# Patient Record
Sex: Female | Born: 1975 | Race: Black or African American | Hispanic: No | Marital: Married | Smoking: Current every day smoker
Health system: Southern US, Community
[De-identification: ages and names within clinical notes are randomized; demographics above are authoritative.]

## PROBLEM LIST (undated history)

## (undated) ENCOUNTER — Inpatient Hospital Stay (HOSPITAL_COMMUNITY): Payer: Self-pay

## (undated) DIAGNOSIS — I10 Essential (primary) hypertension: Secondary | ICD-10-CM

## (undated) DIAGNOSIS — R51 Headache: Secondary | ICD-10-CM

## (undated) DIAGNOSIS — D649 Anemia, unspecified: Secondary | ICD-10-CM

## (undated) DIAGNOSIS — E119 Type 2 diabetes mellitus without complications: Secondary | ICD-10-CM

## (undated) DIAGNOSIS — B999 Unspecified infectious disease: Secondary | ICD-10-CM

## (undated) DIAGNOSIS — S42309A Unspecified fracture of shaft of humerus, unspecified arm, initial encounter for closed fracture: Secondary | ICD-10-CM

## (undated) DIAGNOSIS — B379 Candidiasis, unspecified: Secondary | ICD-10-CM

## (undated) DIAGNOSIS — O139 Gestational [pregnancy-induced] hypertension without significant proteinuria, unspecified trimester: Secondary | ICD-10-CM

## (undated) DIAGNOSIS — A599 Trichomoniasis, unspecified: Secondary | ICD-10-CM

## (undated) HISTORY — DX: Anemia, unspecified: D64.9

## (undated) HISTORY — PX: WISDOM TOOTH EXTRACTION: SHX21

## (undated) HISTORY — DX: Headache: R51

## (undated) HISTORY — DX: Unspecified fracture of shaft of humerus, unspecified arm, initial encounter for closed fracture: S42.309A

## (undated) HISTORY — DX: Candidiasis, unspecified: B37.9

## (undated) HISTORY — DX: Unspecified infectious disease: B99.9

---

## 1998-11-13 ENCOUNTER — Other Ambulatory Visit: Admission: RE | Admit: 1998-11-13 | Discharge: 1998-11-13 | Payer: Self-pay | Admitting: *Deleted

## 1998-12-27 ENCOUNTER — Emergency Department (HOSPITAL_COMMUNITY): Admission: EM | Admit: 1998-12-27 | Discharge: 1998-12-27 | Payer: Self-pay | Admitting: *Deleted

## 1999-03-21 ENCOUNTER — Inpatient Hospital Stay (HOSPITAL_COMMUNITY): Admission: AD | Admit: 1999-03-21 | Discharge: 1999-03-21 | Payer: Self-pay | Admitting: Obstetrics and Gynecology

## 1999-04-30 ENCOUNTER — Inpatient Hospital Stay (HOSPITAL_COMMUNITY): Admission: AD | Admit: 1999-04-30 | Discharge: 1999-04-30 | Payer: Self-pay | Admitting: Obstetrics and Gynecology

## 1999-05-07 DIAGNOSIS — I10 Essential (primary) hypertension: Secondary | ICD-10-CM

## 1999-05-07 HISTORY — DX: Essential (primary) hypertension: I10

## 1999-05-24 ENCOUNTER — Inpatient Hospital Stay (HOSPITAL_COMMUNITY): Admission: AD | Admit: 1999-05-24 | Discharge: 1999-05-26 | Payer: Self-pay | Admitting: Obstetrics and Gynecology

## 2000-07-21 ENCOUNTER — Other Ambulatory Visit: Admission: RE | Admit: 2000-07-21 | Discharge: 2000-07-21 | Payer: Self-pay | Admitting: Gynecology

## 2001-06-29 ENCOUNTER — Emergency Department (HOSPITAL_COMMUNITY): Admission: EM | Admit: 2001-06-29 | Discharge: 2001-06-29 | Payer: Self-pay

## 2001-09-04 ENCOUNTER — Encounter: Admission: RE | Admit: 2001-09-04 | Discharge: 2001-09-04 | Payer: Self-pay | Admitting: Internal Medicine

## 2001-09-04 ENCOUNTER — Encounter: Payer: Self-pay | Admitting: Internal Medicine

## 2001-10-28 ENCOUNTER — Encounter: Payer: Self-pay | Admitting: Internal Medicine

## 2001-10-28 ENCOUNTER — Ambulatory Visit (HOSPITAL_COMMUNITY): Admission: RE | Admit: 2001-10-28 | Discharge: 2001-10-28 | Payer: Self-pay | Admitting: Internal Medicine

## 2002-07-05 ENCOUNTER — Other Ambulatory Visit: Admission: RE | Admit: 2002-07-05 | Discharge: 2002-07-05 | Payer: Self-pay | Admitting: *Deleted

## 2002-08-31 ENCOUNTER — Encounter: Payer: Self-pay | Admitting: *Deleted

## 2002-08-31 ENCOUNTER — Encounter: Admission: RE | Admit: 2002-08-31 | Discharge: 2002-08-31 | Payer: Self-pay | Admitting: *Deleted

## 2002-12-01 ENCOUNTER — Inpatient Hospital Stay (HOSPITAL_COMMUNITY): Admission: AD | Admit: 2002-12-01 | Discharge: 2002-12-01 | Payer: Self-pay | Admitting: *Deleted

## 2002-12-02 ENCOUNTER — Inpatient Hospital Stay (HOSPITAL_COMMUNITY): Admission: AD | Admit: 2002-12-02 | Discharge: 2002-12-02 | Payer: Self-pay | Admitting: Obstetrics and Gynecology

## 2002-12-17 ENCOUNTER — Inpatient Hospital Stay (HOSPITAL_COMMUNITY): Admission: AD | Admit: 2002-12-17 | Discharge: 2002-12-20 | Payer: Self-pay | Admitting: Obstetrics and Gynecology

## 2002-12-17 ENCOUNTER — Encounter: Payer: Self-pay | Admitting: Obstetrics and Gynecology

## 2002-12-18 ENCOUNTER — Encounter: Payer: Self-pay | Admitting: Obstetrics and Gynecology

## 2003-01-05 ENCOUNTER — Inpatient Hospital Stay (HOSPITAL_COMMUNITY): Admission: AD | Admit: 2003-01-05 | Discharge: 2003-01-05 | Payer: Self-pay | Admitting: Obstetrics & Gynecology

## 2003-01-09 ENCOUNTER — Inpatient Hospital Stay (HOSPITAL_COMMUNITY): Admission: AD | Admit: 2003-01-09 | Discharge: 2003-01-09 | Payer: Self-pay | Admitting: *Deleted

## 2003-01-18 ENCOUNTER — Encounter (INDEPENDENT_AMBULATORY_CARE_PROVIDER_SITE_OTHER): Payer: Self-pay

## 2003-01-18 ENCOUNTER — Inpatient Hospital Stay (HOSPITAL_COMMUNITY): Admission: AD | Admit: 2003-01-18 | Discharge: 2003-01-20 | Payer: Self-pay | Admitting: *Deleted

## 2003-01-20 ENCOUNTER — Encounter: Payer: Self-pay | Admitting: *Deleted

## 2003-01-20 ENCOUNTER — Encounter: Payer: Self-pay | Admitting: Obstetrics and Gynecology

## 2003-02-18 ENCOUNTER — Other Ambulatory Visit: Admission: RE | Admit: 2003-02-18 | Discharge: 2003-02-18 | Payer: Self-pay | Admitting: *Deleted

## 2003-12-03 ENCOUNTER — Emergency Department (HOSPITAL_COMMUNITY): Admission: EM | Admit: 2003-12-03 | Discharge: 2003-12-03 | Payer: Self-pay | Admitting: Family Medicine

## 2004-09-12 ENCOUNTER — Emergency Department (HOSPITAL_COMMUNITY): Admission: EM | Admit: 2004-09-12 | Discharge: 2004-09-12 | Payer: Self-pay | Admitting: Emergency Medicine

## 2005-06-19 ENCOUNTER — Emergency Department (HOSPITAL_COMMUNITY): Admission: EM | Admit: 2005-06-19 | Discharge: 2005-06-19 | Payer: Self-pay | Admitting: Family Medicine

## 2005-06-28 ENCOUNTER — Emergency Department (HOSPITAL_COMMUNITY): Admission: EM | Admit: 2005-06-28 | Discharge: 2005-06-28 | Payer: Self-pay | Admitting: Emergency Medicine

## 2005-11-27 ENCOUNTER — Emergency Department (HOSPITAL_COMMUNITY): Admission: EM | Admit: 2005-11-27 | Discharge: 2005-11-27 | Payer: Self-pay | Admitting: Family Medicine

## 2007-06-01 ENCOUNTER — Emergency Department (HOSPITAL_COMMUNITY): Admission: EM | Admit: 2007-06-01 | Discharge: 2007-06-01 | Payer: Self-pay | Admitting: Emergency Medicine

## 2007-07-06 ENCOUNTER — Emergency Department (HOSPITAL_COMMUNITY): Admission: EM | Admit: 2007-07-06 | Discharge: 2007-07-06 | Payer: Self-pay | Admitting: Emergency Medicine

## 2007-08-18 ENCOUNTER — Inpatient Hospital Stay (HOSPITAL_COMMUNITY): Admission: AD | Admit: 2007-08-18 | Discharge: 2007-08-18 | Payer: Self-pay | Admitting: Obstetrics and Gynecology

## 2007-09-17 ENCOUNTER — Ambulatory Visit: Payer: Self-pay | Admitting: Gynecology

## 2008-04-06 ENCOUNTER — Emergency Department (HOSPITAL_COMMUNITY): Admission: EM | Admit: 2008-04-06 | Discharge: 2008-04-06 | Payer: Self-pay | Admitting: Emergency Medicine

## 2009-03-10 ENCOUNTER — Emergency Department (HOSPITAL_COMMUNITY): Admission: EM | Admit: 2009-03-10 | Discharge: 2009-03-10 | Payer: Self-pay | Admitting: Emergency Medicine

## 2009-03-11 ENCOUNTER — Emergency Department (HOSPITAL_COMMUNITY): Admission: EM | Admit: 2009-03-11 | Discharge: 2009-03-11 | Payer: Self-pay | Admitting: Emergency Medicine

## 2009-05-02 ENCOUNTER — Emergency Department (HOSPITAL_COMMUNITY): Admission: EM | Admit: 2009-05-02 | Discharge: 2009-05-02 | Payer: Self-pay | Admitting: Emergency Medicine

## 2009-08-21 ENCOUNTER — Emergency Department (HOSPITAL_COMMUNITY): Admission: EM | Admit: 2009-08-21 | Discharge: 2009-08-21 | Payer: Self-pay | Admitting: Family Medicine

## 2009-12-31 ENCOUNTER — Emergency Department (HOSPITAL_COMMUNITY): Admission: EM | Admit: 2009-12-31 | Discharge: 2009-12-31 | Payer: Self-pay | Admitting: Family Medicine

## 2010-01-02 ENCOUNTER — Emergency Department (HOSPITAL_COMMUNITY): Admission: EM | Admit: 2010-01-02 | Discharge: 2010-01-02 | Payer: Self-pay | Admitting: Family Medicine

## 2010-07-20 LAB — CULTURE, ROUTINE-ABSCESS

## 2010-08-19 ENCOUNTER — Inpatient Hospital Stay (INDEPENDENT_AMBULATORY_CARE_PROVIDER_SITE_OTHER)
Admission: RE | Admit: 2010-08-19 | Discharge: 2010-08-19 | Disposition: A | Discharge status: Home / Self Care | Payer: Self-pay | Source: Ambulatory Visit | Attending: Family Medicine | Admitting: Family Medicine

## 2010-08-19 ENCOUNTER — Ambulatory Visit (INDEPENDENT_AMBULATORY_CARE_PROVIDER_SITE_OTHER): Payer: Self-pay

## 2010-08-19 DIAGNOSIS — S93609A Unspecified sprain of unspecified foot, initial encounter: Secondary | ICD-10-CM

## 2010-09-21 NOTE — H&P (Signed)
   NAME:  Najera, Korena A                       ACCOUNT NO.:  [id number]   MEDICAL RECORD NO.:  [id number]                   PATIENT TYPE:   LOCATION:                                       FACILITY:   PHYSICIAN:  [place] B. Earlene Plater, M.D.               DATE OF BIRTH:  November 11, 1975   DATE OF ADMISSION:  01/18/2003  DATE OF DISCHARGE:                                HISTORY & PHYSICAL   ADMISSION DIAGNOSES:  1. A 37 week intrauterine pregnancy.  2. Mild pre-eclampsia.   HISTORY OF PRESENT ILLNESS:  A 35 year old African American female gravida  4, para 3, at 37+ weeks with mild pre-eclampsia.  Patient has been followed  as an outpatient from home with bed rest and weekly biophysical profiles and  intermittent growth surveillance.  All findings have been reassuring.  Last  estimated fetal weight 6 pounds 4 ounces, on January 14, 2003, with normal  amniotic fluid index.  The patient has occasional mild headache and  occasional intermittent scintillations but no severe headache, and no  permanent visual disturbances.  She has been otherwise stable with blood  pressures in the 120s-130s/80s-90s range.  As she is now 37 weeks and has a  favorable cervix, presents for admission for induction of labor.   OBJECTIVE:  GENERAL:  Same.  ABDOMEN:  Fundal height appropriate.  Fetal heart tones are heard.  VITAL SIGNS:  Weight is 203, blood pressure 120/86.  GENERAL:  Alert and oriented, no acute distress.  SKIN:  Warm and dry.  No lesions.  HEART:  Regular, rate and rhythm.  LUNGS:  Clear to auscultation.  ABDOMEN:  Liver and spleen normal.  No hernias.  No tenderness in the upper  quadrants.  PELVIC:  Cervix is 4-cm dilated, 2-cm long, -2 station and vertex.  No  hyperflexia or clonus in the lower extremities.   ASSESSMENT:  1. A 37+ week intrauterine pregnancy.  2. Mild pre-eclampsia  3. Favorable cervix.   PLAN:  Admission for induction of labor.            Gerri Spore B. Earlene Plater, M.D.    WBD/MEDQ  D:  01/14/2003  T:  01/14/2003  Job:  [id number]

## 2010-09-21 NOTE — H&P (Signed)
NAME:  Danielle [name], Danielle [name]                       ACCOUNT NO.:  [id number]   MEDICAL RECORD NO.:  [id number]                   PATIENT TYPE:  INP   LOCATION:  9151                                 FACILITY:  [facility]   PHYSICIAN:  [place] B. Earlene Plater, M.D.               DATE OF BIRTH:  March 21, 1976   DATE OF ADMISSION:  12/17/2002  DATE OF DISCHARGE:                                HISTORY & PHYSICAL   ADMISSION DIAGNOSES:  1. [name] 32-week intrauterine pregnancy.  2. Mild preeclampsia.  3. Nonreassuring NST in the office x2 today.  4. Biophysical profile 2/10 in the office today.   HISTORY OF PRESENT ILLNESS:  The patient is [name] 35 year old African American  female, gravida 4, para 3 at 70 plus weeks, [facility] February 08, 2003, who  presents for admission with mild preeclampsia and the above issues. She has  been monitored as an outpatient with blood pressures typically in the  130s/80s range on bedrest. She has had reassuring fetal surveillance twice  weekly in the office. Most recently today, she had [name] nonreactive NST,  although she was found to have [name] single 15 x 15 acceleration with good  variability, no decelerations. This was repeated with [name] similar result.  Subsequent biophysical profile today was 2/10, 2 points for fluid volume.  Also an incidental note was made of [name] dilated fetal bladder and apparent  distal  ureteral dilatation without renal hydronephrosis.   Given the above findings, the patient is admitted for continuous electronic  fetal monitoring and repeat  ultrasound testing. She has had steroids 2  weeks ago due to [name] history of preterm cervical change. The patient denies  headaches, visual disturbances, upper  abdominal pain and reports good fetal  activity.   PAST MEDICAL HISTORY:  Chronic  hypertension. She has been off medications  throughout pregnancy with blood pressures outlined above.   PAST SURGICAL HISTORY:  None.   FAMILY HISTORY:  Noncontributory.   MEDICATIONS:   Prenatal vitamins.   ALLERGIES:  Penicillin.   LABORATORY DATA:  Prenatal labs:  [name] positive. Rubella immune. Hepatitis B,  HIV, RPR, GC and Chlamydia all negative. Glucola normal. Most recent  24  hour urine at the end of July 338 mg in 24 hours.   PHYSICAL EXAMINATION:  VITAL SIGNS:  Blood pressure 132/88, weight 193,  fetal heart tones in the 140s.  GENERAL:  Alert and oriented, in no acute distress.  SKIN:  Warm and dry, no lesions.  HEART:  Regular rate and rhythm.  LUNGS:  Clear to auscultation.  ABDOMEN:  Gravid, fundal height appropriate. No upper  abdominal  tenderness.  PELVIC:  Cervix is 2 cm dilated, 3 cm long, ___________ and vertex.   LABORATORY FINDINGS:  An ultrasound in the office today  shows amniotic  fluid index is within normal limits. Biophysical profile is 2/10 as outlined  above. Cystic enlargement of the bladder with possible  dilated distal  ureters incidentally noted. Again no renal hydronephrosis is noted.   ASSESSMENT:  [name] 32 week intrauterine pregnancy with preeclampsia with  nonreassuring tracing in the office, although the overall impression on non  stress testing in the office was reassuring, it was not technically  reactive. Therefore the patient is admitted for continuous monitoring,  repeat ultrasound testing and will repeat [name] 24 hour urine. The patient is  aware that if long-term was to deteriorate delivery would be indicated.                                               Gerri Spore B. Earlene Plater, M.D.    WBD/MEDQ  D:  12/17/2002  T:  12/17/2002  Job:  [id number]

## 2010-09-21 NOTE — Consult Note (Signed)
   NAME:  Danielle Danielle [name], Danielle Danielle [name]                       ACCOUNT NO.:  [id number]   MEDICAL RECORD NO.:  [id number]                   PATIENT TYPE:  MAT   LOCATION:  [facility]C                                 FACILITY:  [facility]   PHYSICIAN:  Lenoard Aden, M.D.             DATE OF BIRTH:  1975-09-03   DATE OF CONSULTATION:  12/01/2002  DATE OF DISCHARGE:                                   CONSULTATION   CHIEF COMPLAINT:  Rule out preeclampsia.   HISTORY OF PRESENT ILLNESS:  The patient is Danielle [name] 35 year old African American  female, G4, P3, at 30-6/[redacted] weeks gestation.  She presents with elevated blood  pressure and preterm cervical change for evaluation.   ALLERGIES:  Remarkable for allergies to PENICILLIN.   MEDICATIONS:  Prenatal vitamins.   PAST MEDICAL HISTORY:  History of SVT x 3.  No other medical or surgical  hospitalization.  History of smoking abuse.  History of hypertension  previously on medications prior to pregnancy.   FAMILY HISTORY:  Family history of hypertension, diabetes, and stroke.   PRENATAL LABORATORY DATA:  Blood type Danielle [name]+.  Rubella immune.  Hepatitis and  HIV negative.   PHYSICAL EXAMINATION:  GENERAL APPEARANCE:  She is Danielle [name] well-developed, well-  nourished, African American female in no acute distress.  HEENT:  Normal.  LUNGS:  Clear.  HEART:  Regular rate and rhythm.  ABDOMEN:  Soft, gravid, and nontender.  No right upper quadrant tenderness.  EXTREMITIES:  DTRs 1+.  No clonus.  2+ pretibial edema noted.  PELVIC:  The cervix per Dr. Earlene Plater is 2 cm dilated in the office.  She has  received betamethasone 12.5 mg IM today.  NEUROLOGIC:  Nonfocal.  NST is reassuring, but no reactive.  No evidence of  decelerations.  No evidence of contractions.   IMPRESSION:  1. Preterm cervical change.  2. 30-week intrauterine pregnancy.  3. History of chronic hypertension with stable blood pressure on serial     management today.  Normal pregnancy-induced hypertension labs noted.    PLAN:  1. Check 24-hour urine.  2.     Follow up in the office within one week.  3. Preeclamptic warnings given.  4. Hypertensive precautions given.                                               Lenoard Aden, M.D.    RJT/MEDQ  D:  12/01/2002  T:  12/01/2002  Job:  [id number]   cc:   [facility]

## 2010-10-31 ENCOUNTER — Inpatient Hospital Stay (INDEPENDENT_AMBULATORY_CARE_PROVIDER_SITE_OTHER)
Admission: RE | Admit: 2010-10-31 | Discharge: 2010-10-31 | Disposition: A | Discharge status: Home / Self Care | Payer: Self-pay | Source: Ambulatory Visit | Attending: Emergency Medicine | Admitting: Emergency Medicine

## 2010-10-31 DIAGNOSIS — I10 Essential (primary) hypertension: Secondary | ICD-10-CM

## 2010-11-11 ENCOUNTER — Emergency Department (HOSPITAL_COMMUNITY)
Admission: EM | Admit: 2010-11-11 | Discharge: 2010-11-11 | Disposition: A | Discharge status: Home / Self Care | Payer: Self-pay | Attending: Emergency Medicine | Admitting: Emergency Medicine

## 2010-11-11 DIAGNOSIS — IMO0002 Reserved for concepts with insufficient information to code with codable children: Secondary | ICD-10-CM | POA: Insufficient documentation

## 2010-11-11 DIAGNOSIS — I10 Essential (primary) hypertension: Secondary | ICD-10-CM | POA: Insufficient documentation

## 2010-11-11 DIAGNOSIS — I499 Cardiac arrhythmia, unspecified: Secondary | ICD-10-CM | POA: Insufficient documentation

## 2010-11-14 LAB — CULTURE, ROUTINE-ABSCESS

## 2011-01-29 LAB — CBC
HCT: 37
Hemoglobin: 12.8
MCHC: 34.5
MCV: 87.1
Platelets: 354
RBC: 4.25
RDW: 14.6
WBC: 11.6 — ABNORMAL HIGH

## 2011-01-29 LAB — GC/CHLAMYDIA PROBE AMP, GENITAL
Chlamydia, DNA Probe: NEGATIVE
GC Probe Amp, Genital: NEGATIVE

## 2011-01-29 LAB — URINALYSIS, ROUTINE W REFLEX MICROSCOPIC
Bilirubin Urine: NEGATIVE
Ketones, ur: NEGATIVE
Nitrite: NEGATIVE
Urobilinogen, UA: 0.2

## 2011-01-29 LAB — WET PREP, GENITAL: Yeast Wet Prep HPF POC: NONE SEEN

## 2011-01-29 LAB — POCT PREGNANCY, URINE
Operator id: 25114
Preg Test, Ur: NEGATIVE

## 2011-05-07 NOTE — L&D Delivery Note (Signed)
Delivery Note Pt's labor continued spontaneously.  At 6:02 AM a viable female "Danielle [name]," was delivered via  (Presentation: ; ROA ).  APGAR: 8, 9; weight 5 lb 1 oz (2295 g).  NICU present at delivery. Placenta status: Intact, Spontaneous, schutz--sent to Pathology secondary to preterm delivery.  Heavy vaginal bleeding accompanied placenta, and 2-3 gushes following, and pt received of cytotec pr.   Cord: 3 vessels with the following complications: Short.  Cord pH: not collected.  Anesthesia:  epidural Episiotomy: none Lacerations: none Suture Repair: n/a Est. Blood Loss (mL): 400  Mom to postpartum.  Baby to nursery-stable. Planning outpatient circumcision.  Wants to Breastfeed. Sent cath u/a.  Per c/w Dr. Pennie Rushing, will start pt on Procardia XL 30mg  po qd.  Luke Falero H 04/10/2012, 6:38 AM

## 2011-08-21 ENCOUNTER — Other Ambulatory Visit: Payer: Self-pay

## 2011-08-21 ENCOUNTER — Encounter (HOSPITAL_COMMUNITY): Payer: Self-pay | Admitting: *Deleted

## 2011-08-21 ENCOUNTER — Emergency Department (HOSPITAL_COMMUNITY)
Admission: EM | Admit: 2011-08-21 | Discharge: 2011-08-21 | Disposition: A | Discharge status: Home / Self Care | Payer: Medicaid Other | Attending: Emergency Medicine | Admitting: Emergency Medicine

## 2011-08-21 ENCOUNTER — Emergency Department (HOSPITAL_COMMUNITY): Payer: Medicaid Other

## 2011-08-21 DIAGNOSIS — R5381 Other malaise: Secondary | ICD-10-CM | POA: Insufficient documentation

## 2011-08-21 DIAGNOSIS — F17200 Nicotine dependence, unspecified, uncomplicated: Secondary | ICD-10-CM | POA: Insufficient documentation

## 2011-08-21 DIAGNOSIS — R5383 Other fatigue: Secondary | ICD-10-CM | POA: Insufficient documentation

## 2011-08-21 DIAGNOSIS — R42 Dizziness and giddiness: Secondary | ICD-10-CM | POA: Insufficient documentation

## 2011-08-21 DIAGNOSIS — R51 Headache: Secondary | ICD-10-CM | POA: Insufficient documentation

## 2011-08-21 DIAGNOSIS — R0602 Shortness of breath: Secondary | ICD-10-CM | POA: Insufficient documentation

## 2011-08-21 DIAGNOSIS — I10 Essential (primary) hypertension: Secondary | ICD-10-CM | POA: Insufficient documentation

## 2011-08-21 DIAGNOSIS — J40 Bronchitis, not specified as acute or chronic: Secondary | ICD-10-CM | POA: Insufficient documentation

## 2011-08-21 HISTORY — DX: Essential (primary) hypertension: I10

## 2011-08-21 LAB — DIFFERENTIAL
Basophils Absolute: 0.1 10*3/uL (ref 0.0–0.1)
Basophils Relative: 1 % (ref 0–1)
Eosinophils Absolute: 0.4 10*3/uL (ref 0.0–0.7)
Eosinophils Relative: 3 % (ref 0–5)
Lymphocytes Relative: 27 % (ref 12–46)
Monocytes Absolute: 1.3 10*3/uL — ABNORMAL HIGH (ref 0.1–1.0)

## 2011-08-21 LAB — COMPREHENSIVE METABOLIC PANEL
ALT: 11 U/L (ref 0–35)
AST: 21 U/L (ref 0–37)
Albumin: 3.9 g/dL (ref 3.5–5.2)
CO2: 21 mEq/L (ref 19–32)
Calcium: 10.3 mg/dL (ref 8.4–10.5)
Creatinine, Ser: 0.51 mg/dL (ref 0.50–1.10)
GFR calc non Af Amer: >90 mL/min (ref >90)
Sodium: 138 mEq/L (ref 135–145)
Total Protein: 7.9 g/dL (ref 6.0–8.3)

## 2011-08-21 LAB — URINALYSIS, ROUTINE W REFLEX MICROSCOPIC
Bilirubin Urine: NEGATIVE
Glucose, UA: NEGATIVE mg/dL
Specific Gravity, Urine: 1.013 (ref 1.005–1.030)
Urobilinogen, UA: 1 mg/dL (ref 0.0–1.0)
pH: 6.5 (ref 5.0–8.0)

## 2011-08-21 LAB — URINE MICROSCOPIC-ADD ON

## 2011-08-21 LAB — CBC
HCT: 41 % (ref 36.0–46.0)
MCHC: 33.2 g/dL (ref 30.0–36.0)
MCV: 88.6 fL (ref 78.0–100.0)
Platelets: 400 10*3/uL (ref 150–400)
RDW: 14 % (ref 11.5–15.5)
WBC: 14.2 10*3/uL — ABNORMAL HIGH (ref 4.0–10.5)

## 2011-08-21 LAB — POCT I-STAT TROPONIN I: Troponin i, poc: 0 ng/mL (ref 0.00–0.08)

## 2011-08-21 MED ORDER — ALBUTEROL SULFATE (5 MG/ML) 0.5% IN NEBU
5.0000 mg | INHALATION_SOLUTION | Freq: Once | RESPIRATORY_TRACT | Status: AC
  Administered 2011-08-21: 5 mg via RESPIRATORY_TRACT
  Filled 2011-08-21: qty 1

## 2011-08-21 MED ORDER — ALBUTEROL SULFATE HFA 108 (90 BASE) MCG/ACT IN AERS
2.0000 | INHALATION_SPRAY | Freq: Four times a day (QID) | RESPIRATORY_TRACT | Status: DC
  Administered 2011-08-21: 2 via RESPIRATORY_TRACT
  Filled 2011-08-21: qty 6.7

## 2011-08-21 MED ORDER — SODIUM CHLORIDE 0.9 % IV BOLUS (SEPSIS)
1000.0000 mL | Freq: Once | INTRAVENOUS | Status: AC
  Administered 2011-08-21: 1000 mL via INTRAVENOUS

## 2011-08-21 NOTE — Discharge Instructions (Signed)
All labs and imaging have been normal today. However it is very important to followup with the cardiologist for close evaluation especially if symptoms worsen. Please return to the emergency department if they do. The x-ray indicated that you have bronchitis. I have provided you with an inhaler here. please use this one to 2 puffs every 4-6 hours  Dizziness Dizziness is a common problem. It is a feeling of unsteadiness or lightheadedness. You may feel like you are about to faint. Dizziness can lead to injury if you stumble or fall. A person of any age group can suffer from dizziness, but dizziness is more common in older adults. CAUSES  Dizziness can be caused by many different things, including:  Middle ear problems.   Standing for too long.   Infections.   An allergic reaction.   Aging.   An emotional response to something, such as the sight of blood.   Side effects of medicines.   Fatigue.   Problems with circulation or blood pressure.   Excess use of alcohol, medicines, or illegal drug use.   Breathing too fast (hyperventilation).   An arrhythmia or problems with your heart rhythm.   Low red blood cell count (anemia).   Pregnancy.   Vomiting, diarrhea, fever, or other illnesses that cause dehydration.   Diseases or conditions such as Parkinson's disease, high blood pressure (hypertension), diabetes, and thyroid problems.   Exposure to extreme heat.  DIAGNOSIS  To find the cause of your dizziness, your caregiver may do a physical exam, lab tests, radiologic imaging scans, or an electrocardiography test (ECG).  TREATMENT  Treatment of dizziness depends on the cause of your symptoms and can vary greatly. HOME CARE INSTRUCTIONS   Drink enough fluids to keep your urine clear or pale yellow. This is especially important in very hot weather. In the elderly, it is also important in cold weather.   If your dizziness is caused by medicines, take them exactly as directed.  When taking blood pressure medicines, it is especially important to get up slowly.   Rise slowly from chairs and steady yourself until you feel okay.   In the morning, first sit up on the side of the bed. When this seems okay, stand slowly while holding onto something until you know your balance is fine.   If you need to stand in one place for a long time, be sure to move your legs often. Tighten and relax the muscles in your legs while standing.   If dizziness continues to be a problem, have someone stay with you for a day or two. Do this until you feel you are well enough to stay alone. Have the person call your caregiver if he or she notices changes in you that are concerning.   Do not drive or use heavy machinery if you feel dizzy.  SEEK IMMEDIATE MEDICAL CARE IF:   Your dizziness or lightheadedness gets worse.   You feel nauseous or vomit.   You develop problems with talking, walking, weakness, or using your arms, hands, or legs.   You are not thinking clearly or you have difficulty forming sentences. It may take a friend or family member to determine if your thinking is normal.   You develop chest pain, abdominal pain, shortness of breath, or sweating.   Your vision changes.   You notice any bleeding.   You have side effects from medicine that seems to be getting worse rather than better.  MAKE SURE YOU:   Understand  these instructions.   Will watch your condition.   Will get help right away if you are not doing well or get worse.  Document Released: 10/16/2000 Document Revised: 04/11/2011 Document Reviewed: 11/09/2010 [facility] Patient Information 2012 [place], [place].

## 2011-08-21 NOTE — ED Notes (Addendum)
Pt complains of lightheadedness which she states is from her HTN. She took her Verapamil this morning and she's on the last refill on it. She's been trying to get a refill on her medications, but she doesn't have insurance. She got her last prescriptions from Urgent Care. The lightheadedness started Sunday night and it's been coming and going. Occasional unifocal PVCs noted in the EKG.

## 2011-08-21 NOTE — ED Notes (Signed)
Pt undressed, in gown, on monitor, continuous pulse oximetry and blood pressure cuff 

## 2011-08-21 NOTE — ED Notes (Signed)
[id number] at triage

## 2011-08-21 NOTE — ED Provider Notes (Signed)
History     CSN: [id number]  Arrival date & time 08/21/11  0102   First MD Initiated Contact with Patient 08/21/11 1005     10:55 AM HPI MERIDETH BOSQUE is a 36 y.o. female complaining of feeling dizzy, lightheaded, and sluggish 4 days. States symptoms seem to worsen today while she was at work. States today she seemed to "black out" while she was sitting down at work. States that she did not fully syncopized. Reports associated with a frontal headache, mild shortness of breath. Denies chest pain, numbness, tingling, aphasia, ataxia. States she was "too weak to work". Reports her blood pressure medications have been less effective and she thinks the dizziness is due to hypertension. H/o early FHx, HTN, and smoking.   The history is provided by the patient.    Past Medical History  Diagnosis Date  . Hypertension     History reviewed. No pertinent past surgical history.  No family history on file.  History  Substance Use Topics  . Smoking status: Current Everyday Smoker  . Smokeless tobacco: Not on file  . Alcohol Use: Yes     occ    OB History    Grav Para Term Preterm Abortions TAB SAB Ect Mult Living                  Review of Systems  Constitutional: Positive for fatigue. Negative for fever and chills.  HENT: Negative for sore throat, neck pain and neck stiffness.   Respiratory: Negative for cough and shortness of breath.   Cardiovascular: Negative for chest pain.  Gastrointestinal: Negative for nausea, vomiting, abdominal pain, diarrhea and constipation.  Genitourinary: Negative for dysuria, urgency, frequency, flank pain, vaginal bleeding, vaginal discharge and pelvic pain.  Musculoskeletal: Negative for myalgias, back pain and gait problem.  Neurological: Positive for weakness. Negative for dizziness, numbness and headaches. Syncope: near syncope.  All other systems reviewed and are negative.    Allergies  Penicillins  Home Medications   Current  Outpatient Rx  Name Route Sig Dispense Refill  . IBUPROFEN 200 MG PO TABS Oral Take 200 mg by mouth every 6 (six) hours as needed. For pain    . VERAPAMIL HCL 80 MG PO TABS Oral Take 80 mg by mouth daily.      BP 143/93  Pulse 88  Temp(Src) 98.3 F (36.8 C) (Oral)  Resp 20  Ht 5\' 7"  (1.702 m)  Wt 165 lb (74.844 kg)  BMI 25.84 kg/m2  SpO2 99%  LMP 07/28/2011  Physical Exam  Vitals reviewed. Constitutional: She is oriented to person, place, and time. Vital signs are normal. She appears well-developed and well-nourished. No distress.  HENT:  Head: Normocephalic and atraumatic.  Right Ear: External ear normal.  Left Ear: External ear normal.  Nose: Nose normal.  Mouth/Throat: Oropharynx is clear and moist. No oropharyngeal exudate.  Eyes: Conjunctivae are normal. Pupils are equal, round, and reactive to light.  Neck: Normal range of motion. Neck supple.  Cardiovascular: Normal rate, regular rhythm and normal heart sounds.  Exam reveals no gallop and no friction rub.   No murmur heard. Pulmonary/Chest: Effort normal and breath sounds normal. She has no wheezes. She has no rhonchi. She has no rales. She exhibits no tenderness.  Abdominal: She exhibits no distension and no mass. There is no tenderness. There is no rebound and no guarding.  Musculoskeletal: Normal range of motion.  Lymphadenopathy:    She has no cervical adenopathy.  Neurological: She is  alert and oriented to person, place, and time. No cranial nerve deficit. She exhibits normal muscle tone. Coordination normal.  Skin: Skin is warm and dry. No rash noted. No erythema. No pallor.    ED Course  Procedures  Results for orders placed during the hospital encounter of 08/21/11  CBC      Component Value Range   WBC 14.2 (*) 4.0 - 10.5 (K/uL)   RBC 4.63  3.87 - 5.11 (MIL/uL)   Hemoglobin 13.6  12.0 - 15.0 (g/dL)   HCT 16.1  09.6 - 04.5 (%)   MCV 88.6  78.0 - 100.0 (fL)   MCH 29.4  26.0 - 34.0 (pg)   MCHC 33.2  30.0  - 36.0 (g/dL)   RDW 40.9  81.1 - 91.4 (%)   Platelets 400  150 - 400 (K/uL)  DIFFERENTIAL      Component Value Range   Neutrophils Relative 61  43 - 77 (%)   Neutro Abs 8.7 (*) 1.7 - 7.7 (K/uL)   Lymphocytes Relative 27  12 - 46 (%)   Lymphs Abs 3.8  0.7 - 4.0 (K/uL)   Monocytes Relative 9  3 - 12 (%)   Monocytes Absolute 1.3 (*) 0.1 - 1.0 (K/uL)   Eosinophils Relative 3  0 - 5 (%)   Eosinophils Absolute 0.4  0.0 - 0.7 (K/uL)   Basophils Relative 1  0 - 1 (%)   Basophils Absolute 0.1  0.0 - 0.1 (K/uL)  COMPREHENSIVE METABOLIC PANEL      Component Value Range   Sodium 138  135 - 145 (mEq/L)   Potassium 3.8  3.5 - 5.1 (mEq/L)   Chloride 103  96 - 112 (mEq/L)   CO2 21  19 - 32 (mEq/L)   Glucose, Bld 88  70 - 99 (mg/dL)   BUN 8  6 - 23 (mg/dL)   Creatinine, Ser 7.82  0.50 - 1.10 (mg/dL)   Calcium 95.6  8.4 - 10.5 (mg/dL)   Total Protein 7.9  6.0 - 8.3 (g/dL)   Albumin 3.9  3.5 - 5.2 (g/dL)   AST 21  0 - 37 (U/L)   ALT 11  0 - 35 (U/L)   Alkaline Phosphatase 108  39 - 117 (U/L)   Total Bilirubin 0.3  0.3 - 1.2 (mg/dL)   GFR calc non Af Amer >90  >90 (mL/min)   GFR calc Af Amer >90  >90 (mL/min)  URINALYSIS, ROUTINE W REFLEX MICROSCOPIC      Component Value Range   Color, Urine YELLOW  YELLOW    APPearance CLOUDY (*) CLEAR    Specific Gravity, Urine 1.013  1.005 - 1.030    pH 6.5  5.0 - 8.0    Glucose, UA NEGATIVE  NEGATIVE (mg/dL)   Hgb urine dipstick SMALL (*) NEGATIVE    Bilirubin Urine NEGATIVE  NEGATIVE    Ketones, ur NEGATIVE  NEGATIVE (mg/dL)   Protein, ur 30 (*) NEGATIVE (mg/dL)   Urobilinogen, UA 1.0  0.0 - 1.0 (mg/dL)   Nitrite NEGATIVE  NEGATIVE    Leukocytes, UA TRACE (*) NEGATIVE   URINE MICROSCOPIC-ADD ON      Component Value Range   Squamous Epithelial / LPF MANY (*) RARE    WBC, UA 3-6  <3 (WBC/hpf)   RBC / HPF 0-2  <3 (RBC/hpf)   Bacteria, UA FEW (*) RARE    Urine-Other MUCOUS PRESENT    POCT I-STAT TROPONIN I      Component Value Range  Troponin i,  poc 0.00  0.00 - 0.08 (ng/mL)   Comment 3           GLUCOSE, CAPILLARY      Component Value Range   Glucose-Capillary 111 (*) 70 - 99 (mg/dL)   Dg Chest 2 View  1/61/0960  *RADIOLOGY REPORT*  Clinical Data: Dizziness, hypertension, smoker.  CHEST - 2 VIEW  Comparison: None.  Findings: Heart is borderline in size.  Mild peribronchial thickening.  No confluent opacities or effusions.  No acute bony abnormality.  IMPRESSION: Bronchitic changes.  Original Report Authenticated By: Cyndie Chime, M.D.    ED ECG REPORT   Date: 08/21/2011  EKG Time: 4:14 PM  Rate: 78  Rhythm: sinus arrhythmia and premature ventricular contractions (PVC),    Axis: Left axis  Intervals:none  ST&T Change: nonspecific ST and T wave changes.   No significant changes since 07/06/2007     MDM  Suspect a viral infection however I did recommend patient followup with cardiology if symptoms persist. Patient slight elevation in white count and bronchitis on x-ray and no other findings. Advised patient to return to the emergency department for worsening symptoms. Patient agrees and is ready for discharge        Thomasene Lot, Cordelia Poche 08/21/11 4540

## 2011-08-21 NOTE — Progress Notes (Signed)
This CM has attempted to meet with patient on two different occasions this morning. However, patient has been occupied with physical exams and tests. Will attempt contact in the early afternoon.

## 2011-08-21 NOTE — ED Notes (Signed)
Pt was sitting at work and blanked out with her eyes open.  Pt sts she is feeling dizzy and doesnot feel like herself.  She has not felt like herself since she got up.  She feels sluggish and tired.    Pt woke up with frontal headache.  No other neuro deficits

## 2011-08-22 LAB — URINE CULTURE: Culture: NO GROWTH

## 2011-08-23 NOTE — ED Provider Notes (Signed)
Medical screening examination/treatment/procedure(s) were performed by non-physician practitioner and as supervising physician I was immediately available for consultation/collaboration.   Ka Flammer L Sade Hollon, MD 08/23/11 1642 

## 2011-09-01 ENCOUNTER — Encounter (HOSPITAL_COMMUNITY): Payer: Self-pay | Admitting: *Deleted

## 2011-09-01 ENCOUNTER — Inpatient Hospital Stay (HOSPITAL_COMMUNITY)
Admission: AD | Admit: 2011-09-01 | Discharge: 2011-09-01 | Disposition: A | Discharge status: Hospice | Payer: Medicaid Other | Source: Ambulatory Visit | Attending: Obstetrics & Gynecology | Admitting: Obstetrics & Gynecology

## 2011-09-01 DIAGNOSIS — O10019 Pre-existing essential hypertension complicating pregnancy, unspecified trimester: Secondary | ICD-10-CM

## 2011-09-01 DIAGNOSIS — I10 Essential (primary) hypertension: Secondary | ICD-10-CM | POA: Insufficient documentation

## 2011-09-01 DIAGNOSIS — O10919 Unspecified pre-existing hypertension complicating pregnancy, unspecified trimester: Secondary | ICD-10-CM

## 2011-09-01 DIAGNOSIS — Z3201 Encounter for pregnancy test, result positive: Secondary | ICD-10-CM | POA: Insufficient documentation

## 2011-09-01 HISTORY — DX: Trichomoniasis, unspecified: A59.9

## 2011-09-01 LAB — URINALYSIS, ROUTINE W REFLEX MICROSCOPIC
Glucose, UA: NEGATIVE mg/dL
Leukocytes, UA: NEGATIVE
Protein, ur: NEGATIVE mg/dL
Specific Gravity, Urine: 1.025 (ref 1.005–1.030)
pH: 6 (ref 5.0–8.0)

## 2011-09-01 LAB — URINE MICROSCOPIC-ADD ON

## 2011-09-01 LAB — POCT PREGNANCY, URINE: Preg Test, Ur: POSITIVE — AB

## 2011-09-01 MED ORDER — LABETALOL HCL 200 MG PO TABS: 200.0000 mg | ORAL_TABLET | Freq: Two times a day (BID) | ORAL | Status: DC

## 2011-09-01 NOTE — MAU Provider Note (Signed)
History     CSN: [id number]  Arrival date and time: 09/01/11 1457   None     No chief complaint on file.  HPI Danielle [name] 36 y.o. LMP 07-28-11.  Client is thinking she is pregnant due to late menses.  Stopped taking BP meds as she was not sure they were safe in pregnancy.  Has no pain or vaginal bleeding today.  Simply wanted to know if she was pregnant and to get the correct BP meds for pregnancy.  Plans to see [facility] when Medicaid is approved.    OB History    Grav Para Term Preterm Abortions TAB SAB Ect Mult Living   4 4 3 1      4       Past Medical History  Diagnosis Date  . Hypertension   . Asthma   . Trichimoniasis     History reviewed. No pertinent past surgical history.  Family History  Problem Relation Age of Onset  . Diabetes Mother   . Hypertension Mother   . Hypertension Sister   . Heart disease Maternal Grandfather     History  Substance Use Topics  . Smoking status: Current Everyday Smoker -- 0.5 packs/day for 15 years    Types: Cigarettes  . Smokeless tobacco: Not on file  . Alcohol Use: Yes     occ prior to pregnancy    Allergies:  Allergies  Allergen Reactions  . Penicillins Swelling    Prescriptions prior to admission  Medication Sig Dispense Refill  . albuterol (PROVENTIL HFA;VENTOLIN HFA) 108 (90 BASE) MCG/ACT inhaler Inhale 2 puffs into the lungs every 6 (six) hours as needed. Shortness of breath      . ibuprofen (ADVIL,MOTRIN) 200 MG tablet Take 200 mg by mouth every 6 (six) hours as needed. For pain      . verapamil (CALAN) 80 MG tablet Take 80 mg by mouth daily.        Review of Systems  Gastrointestinal: Negative for nausea, vomiting and abdominal pain.  Genitourinary:       No vaginal bleeding   Physical Exam   Blood pressure 158/89, pulse 48, temperature 98.4 F (36.9 C), temperature source Oral, resp. rate 16, weight 184 lb (83.462 kg), last menstrual period 07/28/2011.  Physical Exam  Nursing note  and vitals reviewed. Constitutional: She is oriented to person, place, and time. She appears well-developed and well-nourished.  HENT:  Head: Normocephalic.  Eyes: EOM are normal.  Neck: Neck supple.  Musculoskeletal: Normal range of motion.  Neurological: She is alert and oriented to person, place, and time.  Skin: Skin is warm and dry.  Psychiatric: She has a normal mood and affect.    MAU Course  Procedures Results for orders placed during the hospital encounter of 09/01/11 (from the past 24 hour(s))  URINALYSIS, ROUTINE W REFLEX MICROSCOPIC     Status: Abnormal   Collection Time   09/01/11  3:05 PM      Component Value Range   Color, Urine YELLOW  YELLOW    APPearance CLEAR  CLEAR    Specific Gravity, Urine 1.025  1.005 - 1.030    pH 6.0  5.0 - 8.0    Glucose, UA NEGATIVE  NEGATIVE (mg/dL)   Hgb urine dipstick MODERATE (*) NEGATIVE    Bilirubin Urine NEGATIVE  NEGATIVE    Ketones, ur NEGATIVE  NEGATIVE (mg/dL)   Protein, ur NEGATIVE  NEGATIVE (mg/dL)   Urobilinogen, UA 0.2  0.0 - 1.0 (  mg/dL)   Nitrite NEGATIVE  NEGATIVE    Leukocytes, UA NEGATIVE  NEGATIVE   URINE MICROSCOPIC-ADD ON     Status: Normal   Collection Time   09/01/11  3:05 PM      Component Value Range   Squamous Epithelial / LPF RARE  RARE    RBC / HPF 3-6  <3 (RBC/hpf)  POCT PREGNANCY, URINE     Status: Abnormal   Collection Time   09/01/11  3:22 PM      Component Value Range   Preg Test, Ur POSITIVE (*) NEGATIVE    MDM   Assessment and Plan  Early pregnancy Chronic hypertension  Plan rx labetalol 200 mg po bid ordered for client Advised to begin prenatal care as soon as possible Return if you have abdominal pain or vaginal bleeding  Danielle [name] 09/01/2011, 4:08 PM

## 2011-09-01 NOTE — Discharge Instructions (Signed)
Your pregnancy test is positive.  No smoking, no drugs, no alcohol.  Take a prenatal vitamin one by mouth every day.  Eat small frequent snacks to avoid nausea.  Begin prenatal care as soon as possible. Stop your previous blood pressure medication.  Get your prescription filled and begin your medication today. Return if you have abdominal pain or bleeding.

## 2011-09-01 NOTE — MAU Note (Signed)
Pt had +hpt yesterday. Stopped taking verapimil b/c of +hpt,  She has elevated BP and concerned about the pregnancy.

## 2011-09-13 ENCOUNTER — Encounter (HOSPITAL_COMMUNITY): Payer: Self-pay | Admitting: *Deleted

## 2011-09-13 ENCOUNTER — Inpatient Hospital Stay (HOSPITAL_COMMUNITY): Payer: Medicaid Other

## 2011-09-13 ENCOUNTER — Inpatient Hospital Stay (HOSPITAL_COMMUNITY)
Admission: AD | Admit: 2011-09-13 | Discharge: 2011-09-13 | Disposition: A | Discharge status: Home / Self Care | Payer: Medicaid Other | Source: Ambulatory Visit | Attending: Obstetrics and Gynecology | Admitting: Obstetrics and Gynecology

## 2011-09-13 DIAGNOSIS — O09299 Supervision of pregnancy with other poor reproductive or obstetric history, unspecified trimester: Secondary | ICD-10-CM

## 2011-09-13 DIAGNOSIS — O10019 Pre-existing essential hypertension complicating pregnancy, unspecified trimester: Secondary | ICD-10-CM

## 2011-09-13 DIAGNOSIS — O209 Hemorrhage in early pregnancy, unspecified: Secondary | ICD-10-CM | POA: Clinically undetermined

## 2011-09-13 DIAGNOSIS — O10919 Unspecified pre-existing hypertension complicating pregnancy, unspecified trimester: Secondary | ICD-10-CM | POA: Diagnosis present

## 2011-09-13 DIAGNOSIS — R1031 Right lower quadrant pain: Secondary | ICD-10-CM | POA: Insufficient documentation

## 2011-09-13 DIAGNOSIS — O200 Threatened abortion: Secondary | ICD-10-CM

## 2011-09-13 DIAGNOSIS — O26859 Spotting complicating pregnancy, unspecified trimester: Secondary | ICD-10-CM | POA: Insufficient documentation

## 2011-09-13 DIAGNOSIS — R809 Proteinuria, unspecified: Secondary | ICD-10-CM | POA: Diagnosis present

## 2011-09-13 HISTORY — DX: Gestational (pregnancy-induced) hypertension without significant proteinuria, unspecified trimester: O13.9

## 2011-09-13 LAB — URINALYSIS, ROUTINE W REFLEX MICROSCOPIC
Glucose, UA: NEGATIVE mg/dL
Ketones, ur: NEGATIVE mg/dL
Leukocytes, UA: NEGATIVE
Protein, ur: 100 mg/dL — AB
Urobilinogen, UA: 2 mg/dL — ABNORMAL HIGH (ref 0.0–1.0)

## 2011-09-13 LAB — ABO/RH: ABO/RH(D): A POS

## 2011-09-13 LAB — WET PREP, GENITAL: Yeast Wet Prep HPF POC: NONE SEEN

## 2011-09-13 LAB — HCG, QUANTITATIVE, PREGNANCY: hCG, Beta Chain, Quant, S: 3870 m[IU]/mL — ABNORMAL HIGH (ref <5)

## 2011-09-13 MED ORDER — LABETALOL HCL 200 MG PO TABS: 300.0000 mg | ORAL_TABLET | Freq: Two times a day (BID) | ORAL | Status: DC

## 2011-09-13 NOTE — MAU Provider Note (Signed)
History   36 yo G5P3014 at 6 5/7 weeks per LMP presented unannounced c/o brown spotting x about 1 week and RLQ pain today.  Describes pain as "sharp" and intermittent.  Denies fever, syncope, or dysuria.  Does have nausea, no significant vomiting.  Does not remember blood type.  Had baby in 2001 with [facility], has appointment this month for NOB interview.  Pregnancy remarkable for: Chronic hypertension--on Labetalol 200 mg po BID since 4/28 (on Verapamil prior to pregnancy.  Was changed to Labetalol by MAU provider) Hx pre-eclampsia (superimposed on chronic hypertension) last pregnancy Hx asthma--uses inhaler prn. Smoker PCN allergy  Chief Complaint  Patient presents with  . Abdominal Pain    OB History    Grav Para Term Preterm Abortions TAB SAB Ect Mult Living   5 4 3 1      4     All full term except last pregnancy, with labor induced due to pre-eclampsia.  Past Medical History  Diagnosis Date  . Hypertension   . Asthma   . Trichimoniasis   . Pregnancy induced hypertension     Past Surgical History  Procedure Date  . Wisdom tooth extraction     Family History  Problem Relation Age of Onset  . Diabetes Mother   . Hypertension Mother   . Hypertension Sister   . Heart disease Maternal Grandfather     History  Substance Use Topics  . Smoking status: Current Everyday Smoker -- 0.2 packs/day for 15 years    Types: Cigarettes  . Smokeless tobacco: Not on file  . Alcohol Use: No     occ prior to pregnancy    Allergies: PCN Allergies  Allergen Reactions  . Penicillins Swelling    lips    Prescriptions prior to admission  Medication Sig Dispense Refill  . labetalol (NORMODYNE) 200 MG tablet Take 1 tablet (200 mg total) by mouth 2 (two) times daily.  60 tablet  1  . albuterol (PROVENTIL HFA;VENTOLIN HFA) 108 (90 BASE) MCG/ACT inhaler Inhale 2 puffs into the lungs every 6 (six) hours as needed. Shortness of breath        Physical Exam   Blood pressure 143/91, pulse  61, temperature 97.6 F (36.4 C), temperature source Oral, resp. rate 20, height 5\' 5"  (1.651 m), weight 183 lb (83.008 kg), last menstrual period 07/28/2011.  Chest clear Heart RRR without mumur Abd soft, NT Pelvic--small amount white vaginal discharge, no bleeding in vault Uterus--small, NT Adnexa--left side without pain on palpation               right side with mild tenderness on deep palpation, no rebound or guarding.' Ext WNL  ED Course  1st trimester pregnancy Spotting RLQ pain Chronic hypertension--on Labetalol 200 mg po BID  Plan: QHCG, ABO/Rh GC, chlamydia, wet prep, UA [place] for viability, r/o ectopic, ovarian cyst, etc. Per consult with Dr. Normand Sloop, will increase Labetalol to 300 BID.   Nigel Bridgeman, CNM, [place] 09/13/11 4:10p  Addendum: Returned from [place]:  5 1/7 week IUGS, no FP, positive YS.                                 No adnexal masses, small CL cyst on left.                                 (this is 1 1/2  weeks behind LMP dating) A+ type by previous records. QHCG 3870  Urine dipstick shows positive for protein (100--previously 30 on earlier MAU visits) Urine to culture.   Plan: Reviewed findings with patient. Pregnancy may still not be viable, or may just be early. Discussed option of rechecking QHCG level over the next week, or awaiting repeat US in 10-14 days (per radiologist recommendation).   Patient prefers to await repeat US, unless status changes prior to that time.   Precautions reviewed.  Support offered for uncertainty regarding pregnancy status. Office will call patient to schedule f/u US in 10-14 days.  (Has appointment scheduled on 5/30 for NOB interview. Will increase Labetalol to 300 mg po BID (new Rx given). Will need 24 hour urine in early pregnancy if pregnancy is viable.   Nigel Bridgeman, CNM, [place] 09/13/11 5:30p

## 2011-09-13 NOTE — Discharge Instructions (Signed)
Vaginal Bleeding During Pregnancy  A small amount of bleeding from the vagina can happen anytime during pregnancy. Be sure to tell your doctor about all vaginal bleeding.   HOME CARE   Get plenty of rest and sleep.   Count the number of pads you use each day. Do not use tampons.   Save any tissue you pass for your doctor to see.   Do not exercise   Do not do any heavy lifting.   Avoid going up and down stairs. If you must climb stairs, go slowly.   Do not have sex (intercourse) or orgasms until approved by your doctor.   Do not douche.   Only take medicine as told by your doctor. Do not take aspirin.   Eat healthy.   Always keep your follow-up appointments.  GET HELP RIGHT AWAY IF:    You feel the baby moving less or not moving at all.   The bleeding gets worse.   You have very painful cramps or pain in your stomach or back.   You pass large clots or anything that looks like tissue.   You have a temperature by mouth above 102 F (38.9 C).   You feel very weak.   You have chills.   You feel dizzy or pass out (faint).   You have a gush of fluid from the vagina.  MAKE SURE YOU:    Understand these instructions.   Will watch your condition.   Will get help right away if you are not doing well or get worse.  Document Released: 01/30/2008 Document Revised: 04/11/2011 Document Reviewed: 03/28/2009  [employer] Patient Information 2012 [employer]

## 2011-09-13 NOTE — MAU Note (Signed)
abd pain started this mroning.  Sharp pains, comes and goes on rt lower abd. Thick brownish d/c, some vag irritation.

## 2011-09-14 LAB — GC/CHLAMYDIA PROBE AMP, GENITAL: Chlamydia, DNA Probe: NEGATIVE

## 2011-09-15 LAB — URINE CULTURE

## 2011-09-18 ENCOUNTER — Encounter (HOSPITAL_COMMUNITY): Payer: Self-pay | Admitting: *Deleted

## 2011-09-18 ENCOUNTER — Inpatient Hospital Stay (HOSPITAL_COMMUNITY)
Admission: AD | Admit: 2011-09-18 | Discharge: 2011-09-18 | Disposition: A | Discharge status: Home / Self Care | Payer: Medicaid Other | Source: Ambulatory Visit | Attending: Obstetrics and Gynecology | Admitting: Obstetrics and Gynecology

## 2011-09-18 ENCOUNTER — Inpatient Hospital Stay (HOSPITAL_COMMUNITY): Payer: Medicaid Other

## 2011-09-18 DIAGNOSIS — O26851 Spotting complicating pregnancy, first trimester: Secondary | ICD-10-CM

## 2011-09-18 DIAGNOSIS — O26859 Spotting complicating pregnancy, unspecified trimester: Secondary | ICD-10-CM | POA: Insufficient documentation

## 2011-09-18 MED ORDER — PROMETHAZINE HCL 25 MG PO TABS: 25.0000 mg | ORAL_TABLET | Freq: Four times a day (QID) | ORAL | Status: DC | PRN

## 2011-09-18 MED ORDER — ONDANSETRON 8 MG PO TBDP: 8.0000 mg | ORAL_TABLET | Freq: Three times a day (TID) | ORAL | Status: AC | PRN

## 2011-09-18 NOTE — MAU Note (Signed)
H. Steelman, CNM at bedside.  Assessment done and poc discussed with pt.  

## 2011-09-18 NOTE — Discharge Instructions (Signed)
Vaginal Bleeding During Pregnancy, First Trimester A small amount of bleeding (spotting) is relatively common in early pregnancy. It usually stops on its own. There are many causes for bleeding or spotting in early pregnancy. Some bleeding may be related to the pregnancy and some may not. Cramping with the bleeding is more serious and concerning. Tell your caregiver if you have any vaginal bleeding.  CAUSES   It is normal in most cases.   The pregnancy ends (miscarriage).   The pregnancy may end (threatened miscarriage).   Infection or inflammation of the cervix.   Growths (polyps) on the cervix.   Pregnancy happens outside of the uterus and in a fallopian tube (tubal pregnancy).   Many tiny cysts in the uterus instead of pregnancy tissue (molar pregnancy).  SYMPTOMS  Vaginal bleeding or spotting with or without cramps. DIAGNOSIS  To evaluate the pregnancy, your caregiver may:  Do a pelvic exam.   Take blood tests.   Do an ultrasound.  It is very important to follow your caregiver's instructions.  TREATMENT   Evaluation of the pregnancy with blood tests and ultrasound.   Bed rest (getting up to use the bathroom only).   Rho-gam immunization if the mother is Rh negative and the father is Rh positive.  HOME CARE INSTRUCTIONS   If your caregiver orders bed rest, you may need to make arrangements for the care of other children and for other responsibilities. However, your caregiver may allow you to continue light activity.   Keep track of the number of pads you use each day, how often you change pads and how soaked (saturated) they are. Write this down.   Do not use tampons. Do not douche.   Do not have sexual intercourse or orgasms until approved by your physician.   Save any tissue that you pass for your caregiver to see.   Take medicine for cramps only with your caregiver's permission.   Do not take aspirin because it can make you bleed.  SEEK IMMEDIATE MEDICAL CARE  IF:   You experience severe cramps in your stomach, back or belly (abdomen).   You have an oral temperature above 102 F (38.9 C), not controlled by medicine.   You pass large clots or tissue.   Your bleeding increases or you become light-headed, weak or have fainting episodes.   You develop chills.   You are leaking or have a gush of fluid from your vagina.   You pass out while having a bowel movement. That may mean you have a ruptured tubal pregnancy.  Document Released: 01/30/2005 Document Revised: 04/11/2011 Document Reviewed: 08/11/2008 [facility] Patient Information 2012 [place], [place]. Pregnancy - First Trimester During sexual intercourse, millions of sperm go into the vagina. Only 1 sperm will penetrate and fertilize the female egg while it is in the Fallopian tube. One week later, the fertilized egg implants into the wall of the uterus. An embryo begins to develop into a baby. At 6 to 8 weeks, the eyes and face are formed and the heartbeat can be seen on ultrasound. At the end of 12 weeks (first trimester), all the baby's organs are formed. Now that you are pregnant, you will want to do everything you can to have a healthy baby. Two of the most important things are to get good prenatal care and follow your caregiver's instructions. Prenatal care is all the medical care you receive before the baby's birth. It is given to prevent, find, and treat problems during the pregnancy and  childbirth. PRENATAL EXAMS  During prenatal visits, your weight, blood pressure and urine are checked. This is done to make sure you are healthy and progressing normally during the pregnancy.   A pregnant woman should gain 25 to 35 pounds during the pregnancy. However, if you are over weight or underweight, your caregiver will advise you regarding your weight.   Your caregiver will ask and answer questions for you.   Blood work, cervical cultures, other necessary tests and a Pap test are done during your  prenatal exams. These tests are done to check on your health and the probable health of your baby. Tests are strongly recommended and done for HIV with your permission. This is the virus that causes AIDS. These tests are done because medications can be given to help prevent your baby from being born with this infection should you have been infected without knowing it. Blood work is also used to find out your blood type, previous infections and follow your blood levels (hemoglobin).   Low hemoglobin (anemia) is common during pregnancy. Iron and vitamins are given to help prevent this. Later in the pregnancy, blood tests for diabetes will be done along with any other tests if any problems develop. You may need tests to make sure you and the baby are doing well.   You may need other tests to make sure you and the baby are doing well.  CHANGES DURING THE FIRST TRIMESTER (THE FIRST 3 MONTHS OF PREGNANCY) Your body goes through many changes during pregnancy. They vary from person to person. Talk to your caregiver about changes you notice and are concerned about. Changes can include:  Your menstrual period stops.   The egg and sperm carry the genes that determine what you look like. Genes from you and your partner are forming a baby. The female genes determine whether the baby is a boy or a girl.   Your body increases in girth and you may feel bloated.   Feeling sick to your stomach (nauseous) and throwing up (vomiting). If the vomiting is uncontrollable, call your caregiver.   Your breasts will begin to enlarge and become tender.   Your nipples may stick out more and become darker.   The need to urinate more. Painful urination may mean you have a bladder infection.   Tiring easily.   Loss of appetite.   Cravings for certain kinds of food.   At first, you may gain or lose a couple of pounds.   You may have changes in your emotions from day to day (excited to be pregnant or concerned something  may go wrong with the pregnancy and baby).   You may have more vivid and strange dreams.  HOME CARE INSTRUCTIONS   It is very important to avoid all smoking, alcohol and un-prescribed drugs during your pregnancy. These affect the formation and growth of the baby. Avoid chemicals while pregnant to ensure the delivery of a healthy infant.   Start your prenatal visits by the 12th week of pregnancy. They are usually scheduled monthly at first, then more often in the last 2 months before delivery. Keep your caregiver's appointments. Follow your caregiver's instructions regarding medication use, blood and lab tests, exercise, and diet.   During pregnancy, you are providing food for you and your baby. Eat regular, well-balanced meals. Choose foods such as meat, fish, milk and other low fat dairy products, vegetables, fruits, and whole-grain breads and cereals. Your caregiver will tell you of the ideal weight gain.  You can help morning sickness by keeping soda crackers at the bedside. Eat a couple before arising in the morning. You may want to use the crackers without salt on them.   Eating 4 to 5 small meals rather than 3 large meals a day also may help the nausea and vomiting.   Drinking liquids between meals instead of during meals also seems to help nausea and vomiting.   A physical sexual relationship may be continued throughout pregnancy if there are no other problems. Problems may be early (premature) leaking of amniotic fluid from the membranes, vaginal bleeding, or belly (abdominal) pain.   Exercise regularly if there are no restrictions. Check with your caregiver or physical therapist if you are unsure of the safety of some of your exercises. Greater weight gain will occur in the last 2 trimesters of pregnancy. Exercising will help:   Control your weight.   Keep you in shape.   Prepare you for labor and delivery.   Help you lose your pregnancy weight after you deliver your baby.    Wear a good support or jogging bra for breast tenderness during pregnancy. This may help if worn during sleep too.   Ask when prenatal classes are available. Begin classes when they are offered.   Do not use hot tubs, steam rooms or saunas.   Wear your seat belt when driving. This protects you and your baby if you are in an accident.   Avoid raw meat, uncooked cheese, cat litter boxes and soil used by cats throughout the pregnancy. These carry germs that can cause birth defects in the baby.   The first trimester is a good time to visit your dentist for your dental health. Getting your teeth cleaned is OK. Use a softer toothbrush and brush gently during pregnancy.   Ask for help if you have financial, counseling or nutritional needs during pregnancy. Your caregiver will be able to offer counseling for these needs as well as refer you for other special needs.   Do not take any medications or herbs unless told by your caregiver.   Inform your caregiver if there is any mental or physical domestic violence.   Make a list of emergency phone numbers of family, friends, hospital, and police and fire departments.   Write down your questions. Take them to your prenatal visit.   Do not douche.   Do not cross your legs.   If you have to stand for long periods of time, rotate you feet or take small steps in a circle.   You may have more vaginal secretions that may require a sanitary pad. Do not use tampons or scented sanitary pads.  MEDICATIONS AND DRUG USE IN PREGNANCY  Take prenatal vitamins as directed. The vitamin should contain 1 milligram of folic acid. Keep all vitamins out of reach of children. Only a couple vitamins or tablets containing iron may be fatal to a baby or young child when ingested.   Avoid use of all medications, including herbs, over-the-counter medications, not prescribed or suggested by your caregiver. Only take over-the-counter or prescription medicines for pain,  discomfort, or fever as directed by your caregiver. Do not use aspirin, ibuprofen, or naproxen unless directed by your caregiver.   Let your caregiver also know about herbs you may be using.   Alcohol is related to a number of birth defects. This includes fetal alcohol syndrome. All alcohol, in any form, should be avoided completely. Smoking will cause low birth rate and premature  babies.   Street or illegal drugs are very harmful to the baby. They are absolutely forbidden. A baby born to an addicted mother will be addicted at birth. The baby will go through the same withdrawal an adult does.   Let your caregiver know about any medications that you have to take and for what reason you take them.  MISCARRIAGE IS COMMON DURING PREGNANCY A miscarriage does not mean you did something wrong. It is not a reason to worry about getting pregnant again. Your caregiver will help you with questions you may have. If you have a miscarriage, you may need minor surgery. SEEK MEDICAL CARE IF:  You have any concerns or worries during your pregnancy. It is better to call with your questions if you feel they cannot wait, rather than worry about them. SEEK IMMEDIATE MEDICAL CARE IF:   An unexplained oral temperature above 102 F (38.9 C) develops, or as your caregiver suggests.   You have leaking of fluid from the vagina (birth canal). If leaking membranes are suspected, take your temperature and inform your caregiver of this when you call.   There is vaginal spotting or bleeding. Notify your caregiver of the amount and how many pads are used.   You develop a bad smelling vaginal discharge with a change in the color.   You continue to feel sick to your stomach (nauseated) and have no relief from remedies suggested. You vomit blood or coffee ground-like materials.   You lose more than 2 pounds of weight in 1 week.   You gain more than 2 pounds of weight in 1 week and you notice swelling of your face, hands,  feet, or legs.   You gain 5 pounds or more in 1 week (even if you do not have swelling of your hands, face, legs, or feet).   You get exposed to [place] measles and have never had them.   You are exposed to fifth disease or chickenpox.   You develop belly (abdominal) pain. Round ligament discomfort is a common non-cancerous (benign) cause of abdominal pain in pregnancy. Your caregiver still must evaluate this.   You develop headache, fever, diarrhea, pain with urination, or shortness of breath.   You fall or are in a car accident or have any kind of trauma.   There is mental or physical violence in your home.  Document Released: 04/16/2001 Document Revised: 04/11/2011 Document Reviewed: 10/18/2008 [facility] Patient Information 2012 [place], [place]. ABCs of Pregnancy A Antepartum care is very important. Be sure you see your doctor and get prenatal care as soon as you think you are pregnant. At this time, you will be tested for infection, genetic abnormalities and potential problems with you and the pregnancy. This is the time to discuss diet, exercise, work, medications, labor, pain medication during labor and the possibility of a cesarean delivery. Ask any questions that may concern you. It is important to see your doctor regularly throughout your pregnancy. Avoid exposure to toxic substances and chemicals - such as cleaning solvents, lead and mercury, some insecticides, and paint. Pregnant women should avoid exposure to paint fumes, and fumes that cause you to feel ill, dizzy or faint. When possible, it is a good idea to have a pre-pregnancy consultation with your caregiver to begin some important recommendations your caregiver suggests such as, taking folic acid, exercising, quitting smoking, avoiding alcoholic beverages, etc. B Breastfeeding is the healthiest choice for both you and your baby. It has many nutritional benefits for the baby  and health benefits for the mother. It also creates a  very tight and loving bond between the baby and mother. Talk to your doctor, your family and friends, and your employer about how you choose to feed your baby and how they can support you in your decision. Not all birth defects can be prevented, but a woman can take actions that may increase her chance of having a healthy baby. Many birth defects happen very early in pregnancy, sometimes before a woman even knows she is pregnant. Birth defects or abnormalities of any child in your or the father's family should be discussed with your caregiver. Get a good support bra as your breast size changes. Wear it especially when you exercise and when nursing.  C Celebrate the news of your pregnancy with the your spouse/father and family. Childbirth classes are helpful to take for you and the spouse/father because it helps to understand what happens during the pregnancy, labor and delivery. Cesarean delivery should be discussed with your doctor so you are prepared for that possibility. The pros and cons of circumcision if it is a boy, should be discussed with your pediatrician. Cigarette smoking during pregnancy can result in low birth weight babies. It has been associated with infertility, miscarriages, tubal pregnancies, infant death (mortality) and poor health (morbidity) in childhood. Additionally, cigarette smoking may cause long-term learning disabilities. If you smoke, you should try to quit before getting pregnant and not smoke during the pregnancy. Secondary smoke may also harm a mother and her developing baby. It is a good idea to ask people to stop smoking around you during your pregnancy and after the baby is born. Extra calcium is necessary when you are pregnant and is found in your prenatal vitamin, in dairy products, green leafy vegetables and in calcium supplements. D A healthy diet according to your current weight and height, along with vitamins and mineral supplements should be discussed with your  caregiver. Domestic abuse or violence should be made known to your doctor right away to get the situation corrected. Drink more water when you exercise to keep hydrated. Discomfort of your back and legs usually develops and progresses from the middle of the second trimester through to delivery of the baby. This is because of the enlarging baby and uterus, which may also affect your balance. Do not take illegal drugs. Illegal drugs can seriously harm the baby and you. Drink extra fluids (water is best) throughout pregnancy to help your body keep up with the increases in your blood volume. Drink at least 6 to 8 glasses of water, fruit juice, or milk each day. A good way to know you are drinking enough fluid is when your urine looks almost like clear water or is very light yellow.  E Eat healthy to get the nutrients you and your unborn baby need. Your meals should include the five basic food groups. Exercise (30 minutes of light to moderate exercise a day) is important and encouraged during pregnancy, if there are no medical problems or problems with the pregnancy. Exercise that causes discomfort or dizziness should be stopped and reported to your caregiver. Emotions during pregnancy can change from being ecstatic to depression and should be understood by you, your partner and your family. F Fetal screening with ultrasound, amniocentesis and monitoring during pregnancy and labor is common and sometimes necessary. Take 400 micrograms of folic acid daily both before, when possible, and during the first few months of pregnancy to reduce the risk of birth defects  of the brain and spine. All women who could possibly become pregnant should take a vitamin with folic acid, every day. It is also important to eat a healthy diet with fortified foods (enriched grain products, including cereals, rice, breads, and pastas) and foods with natural sources of folate (orange juice, green leafy vegetables, beans, peanuts, broccoli,  asparagus, peas, and lentils). The father should be involved with all aspects of the pregnancy including, the prenatal care, childbirth classes, labor, delivery, and postpartum time. Fathers may also have emotional concerns about being a father, financial needs, and raising a family. G Genetic testing should be done appropriately. It is important to know your family and the father's history. If there have been problems with pregnancies or birth defects in your family, report these to your doctor. Also, genetic counselors can talk with you about the information you might need in making decisions about having a family. You can call a major medical center in your area for help in finding a board-certified genetic counselor. Genetic testing and counseling should be done before pregnancy when possible, especially if there is a history of problems in the mother's or father's family. Certain ethnic backgrounds are more at risk for genetic defects. H Get familiar with the hospital where you will be having your baby. Get to know how long it takes to get there, the labor and delivery area, and the hospital procedures. Be sure your medical insurance is accepted there. Get your home ready for the baby including, clothes, the baby's room (when possible), furniture and car seat. Hand washing is important throughout the day, especially after handling raw meat and poultry, changing the baby's diaper or using the bathroom. This can help prevent the spread of many bacteria and viruses that cause infection. Your hair may become dry and thinner, but will return to normal a few weeks after the baby is born. Heartburn is a common problem that can be treated by taking antacids recommended by your caregiver, eating smaller meals 5 or 6 times a day, not drinking liquids when eating, drinking between meals and raising the head of your bed 2 to 3 inches. I Insurance to cover you, the baby, doctor and hospital should be reviewed so that  you will be prepared to pay any costs not covered by your insurance plan. If you do not have medical insurance, there are usually clinics and services available for you in your community. Take 30 milligrams of iron during your pregnancy as prescribed by your doctor to reduce the risk of low red blood cells (anemia) later in pregnancy. All women of childbearing age should eat a diet rich in iron. J There should be a joint effort for the mother, father and any other children to adapt to the pregnancy financially, emotionally, and psychologically during the pregnancy. Join a support group for moms-to-be. Or, join a class on parenting or childbirth. Have the family participate when possible. K Know your limits. Let your caregiver know if you experience any of the following:   Pain of any kind.   Strong cramps.   You develop a lot of weight in a short period of time (5 pounds in 3 to 5 days).   Vaginal bleeding, leaking of amniotic fluid.   Headache, vision problems.   Dizziness, fainting, shortness of breath.   Chest pain.   Fever of 102 F (38.9 C) or higher.   Gush of clear fluid from your vagina.   Painful urination.   Domestic violence.  Irregular heartbeat (palpitations).   Rapid beating of the heart (tachycardia).   Constant feeling sick to your stomach (nauseous) and vomiting.   Trouble walking, fluid retention (edema).   Muscle weakness.   If your baby has decreased activity.   Persistent diarrhea.   Abnormal vaginal discharge.   Uterine contractions at 20-minute intervals.   Back pain that travels down your leg.  L Learn and practice that what you eat and drink should be in moderation and healthy for you and your baby. Legal drugs such as alcohol and caffeine are important issues for pregnant women. There is no safe amount of alcohol a woman can drink while pregnant. Fetal alcohol syndrome, a disorder characterized by growth retardation, facial abnormalities,  and central nervous system dysfunction, is caused by a woman's use of alcohol during pregnancy. Caffeine, found in tea, coffee, soft drinks and chocolate, should also be limited. Be sure to read labels when trying to cut down on caffeine during pregnancy. More than 200 foods, beverages, and over-the-counter medications contain caffeine and have a high salt content! There are coffees and teas that do not contain caffeine. M Medical conditions such as diabetes, epilepsy, and high blood pressure should be treated and kept under control before pregnancy when possible, but especially during pregnancy. Ask your caregiver about any medications that may need to be changed or adjusted during pregnancy. If you are currently taking any medications, ask your caregiver if it is safe to take them while you are pregnant or before getting pregnant when possible. Also, be sure to discuss any herbs or vitamins you are taking. They are medicines, too! Discuss with your doctor all medications, prescribed and over-the-counter, that you are taking. During your prenatal visit, discuss the medications your doctor may give you during labor and delivery. N Never be afraid to ask your doctor or caregiver questions about your health, the progress of the pregnancy, family problems, stressful situations, and recommendation for a pediatrician, if you do not have one. It is better to take all precautions and discuss any questions or concerns you may have during your office visits. It is a good idea to write down your questions before you visit the doctor. O Over-the-counter cough and cold remedies may contain alcohol or other ingredients that should be avoided during pregnancy. Ask your caregiver about prescription, herbs or over-the-counter medications that you are taking or may consider taking while pregnant.  P Physical activity during pregnancy can benefit both you and your baby by lessening discomfort and fatigue, providing a sense of  well-being, and increasing the likelihood of early recovery after delivery. Light to moderate exercise during pregnancy strengthens the belly (abdominal) and back muscles. This helps improve posture. Practicing yoga, walking, swimming, and cycling on a stationary bicycle are usually safe exercises for pregnant women. Avoid scuba diving, exercise at high altitudes (over 3000 feet), skiing, horseback riding, contact sports, etc. Always check with your doctor before beginning any kind of exercise, especially during pregnancy and especially if you did not exercise before getting pregnant. Q Queasiness, stomach upset and morning sickness are common during pregnancy. Eating a couple of crackers or dry toast before getting out of bed. Foods that you normally love may make you feel sick to your stomach. You may need to substitute other nutritious foods. Eating 5 or 6 small meals a day instead of 3 large ones may make you feel better. Do not drink with your meals, drink between meals. Questions that you have should be written  down and asked during your prenatal visits. R Read about and make plans to baby-proof your home. There are important tips for making your home a safer environment for your baby. Review the tips and make your home safer for you and your baby. Read food labels regarding calories, salt and fat content in the food. S Saunas, hot tubs, and steam rooms should be avoided while you are pregnant. Excessive high heat may be harmful during your pregnancy. Your caregiver will screen and examine you for sexually transmitted diseases and genetic disorders during your prenatal visits. Learn the signs of labor. Sexual relations while pregnant is safe unless there is a medical or pregnancy problem and your caregiver advises against it. T Traveling long distances should be avoided especially in the third trimester of your pregnancy. If you do have to travel out of state, be sure to take a copy of your medical  records and medical insurance plan with you. You should not travel long distances without seeing your doctor first. Most airlines will not allow you to travel after 36 weeks of pregnancy. Toxoplasmosis is an infection caused by a parasite that can seriously harm an unborn baby. Avoid eating undercooked meat and handling cat litter. Be sure to wear gloves when gardening. Tingling of the hands and fingers is not unusual and is due to fluid retention. This will go away after the baby is born. U Womb (uterus) size increases during the first trimester. Your kidneys will begin to function more efficiently. This may cause you to feel the need to urinate more often. You may also leak urine when sneezing, coughing or laughing. This is due to the growing uterus pressing against your bladder, which lies directly in front of and slightly under the uterus during the first few months of pregnancy. If you experience burning along with frequency of urination or bloody urine, be sure to tell your doctor. The size of your uterus in the third trimester may cause a problem with your balance. It is advisable to maintain good posture and avoid wearing high heels during this time. An ultrasound of your baby may be necessary during your pregnancy and is safe for you and your baby. V Vaccinations are an important concern for pregnant women. Get needed vaccines before pregnancy. Center for Disease Control ([employer]) has clear guidelines for the use of vaccines during pregnancy. Review the list, be sure to discuss it with your doctor. Prenatal vitamins are helpful and healthy for you and the baby. Do not take extra vitamins except what is recommended. Taking too much of certain vitamins can cause overdose problems. Continuous vomiting should be reported to your caregiver. Varicose veins may appear especially if there is a family history of varicose veins. They should subside after the delivery of the baby. Support hose helps if there  is leg discomfort. W Being overweight or underweight during pregnancy may cause problems. Try to get within 15 pounds of your ideal weight before pregnancy. Remember, pregnancy is not a time to be dieting! Do not stop eating or start skipping meals as your weight increases. Both you and your baby need the calories and nutrition you receive from a healthy diet. Be sure to consult with your doctor about your diet. There is a formula and diet plan available depending on whether you are overweight or underweight. Your caregiver or nutritionist can help and advise you if necessary. X Avoid X-rays. If you must have dental work or diagnostic tests, tell your dentist or physician  that you are pregnant so that extra care can be taken. X-rays should only be taken when the risks of not taking them outweigh the risk of taking them. If needed, only the minimum amount of radiation should be used. When X-rays are necessary, protective lead shields should be used to cover areas of the body that are not being X-rayed. Y Your baby loves you. Breastfeeding your baby creates a loving and very close bond between the two of you. Give your baby a healthy environment to live in while you are pregnant. Infants and children require constant care and guidance. Their health and safety should be carefully watched at all times. After the baby is born, rest or take a nap when the baby is sleeping. Z Get your ZZZs. Be sure to get plenty of rest. Resting on your side as often as possible, especially on your left side is advised. It provides the best circulation to your baby and helps reduce swelling. Try taking a nap for 30 to 45 minutes in the afternoon when possible. After the baby is born rest or take a nap when the baby is sleeping. Try elevating your feet for that amount of time when possible. It helps the circulation in your legs and helps reduce swelling.  Most information courtesy of the CDC. Document Released: 04/22/2005 Document  Revised: 04/11/2011 Document Reviewed: 01/04/2009 [facility] Patient Information 2012 [place], [place].

## 2011-09-20 ENCOUNTER — Telehealth: Payer: Self-pay

## 2011-09-20 NOTE — Telephone Encounter (Signed)
Message copied by Janeece Agee on Fri Sep 20, 2011 11:50 AM ------      Message from: Cornelius Moras      Created: Fri Sep 13, 2011  5:37 PM      Regarding: F/U visit for [place]       Please get this patient scheduled for a repeat US for viability in 10-14 days from today (09/13/11).  See notes.            Thanks!      VL:

## 2011-09-20 NOTE — Telephone Encounter (Signed)
Spoke with patient informing her f/u viability u/s is needed per VL. Appt scheduled May 22nd at 9 am & f/u with [facility] at 9:30 am. Note also states pt has a NOB interview scheduled in May. I didn't see a NOB interview scheduled in the system. Scheduled NOB interview on June 3rd at 8:45 am & work up had to be scheduled on the next dayJune 4th @ 3pm due to limited openings. Pt agrees & voices understanding.

## 2011-09-24 ENCOUNTER — Other Ambulatory Visit: Payer: Self-pay

## 2011-09-24 DIAGNOSIS — O3680X0 Pregnancy with inconclusive fetal viability, not applicable or unspecified: Secondary | ICD-10-CM

## 2011-09-25 ENCOUNTER — Ambulatory Visit (INDEPENDENT_AMBULATORY_CARE_PROVIDER_SITE_OTHER): Payer: Self-pay | Admitting: Obstetrics and Gynecology

## 2011-09-25 ENCOUNTER — Other Ambulatory Visit: Payer: Self-pay | Admitting: Obstetrics and Gynecology

## 2011-09-25 ENCOUNTER — Encounter: Payer: Self-pay | Admitting: Obstetrics and Gynecology

## 2011-09-25 ENCOUNTER — Ambulatory Visit (INDEPENDENT_AMBULATORY_CARE_PROVIDER_SITE_OTHER): Payer: Self-pay

## 2011-09-25 VITALS — BP 110/82 | Wt 185.0 lb

## 2011-09-25 DIAGNOSIS — Z331 Pregnant state, incidental: Secondary | ICD-10-CM

## 2011-09-25 DIAGNOSIS — O26849 Uterine size-date discrepancy, unspecified trimester: Secondary | ICD-10-CM

## 2011-09-25 DIAGNOSIS — O3680X0 Pregnancy with inconclusive fetal viability, not applicable or unspecified: Secondary | ICD-10-CM

## 2011-09-25 LAB — US OB TRANSVAGINAL

## 2011-09-25 MED ORDER — PRENATAL VITAMINS (DIS) PO TABS: 1.0000 | ORAL_TABLET | ORAL | Status: DC

## 2011-09-25 NOTE — Progress Notes (Signed)
No complaints today per pt.  

## 2011-09-25 NOTE — Progress Notes (Signed)
Addended by: Janeece Agee on: 09/25/2011 10:03 AM   Modules accepted: Orders

## 2011-09-25 NOTE — Progress Notes (Signed)
Patient ID: Danielle [name], female   DOB: 1975/09/16, 36 y.o.   MRN: [id number] Reviewed spotting in pg, [place] results, single IUP fetal pole and yolk sac seen fhts 173, change EDC to 05/15/12. Lavera Guise, CNM

## 2011-09-26 LAB — PRENATAL PANEL VII
Antibody Screen: NEGATIVE
Basophils Absolute: 0.1 10*3/uL (ref 0.0–0.1)
Eosinophils Absolute: 0.2 10*3/uL (ref 0.0–0.7)
Eosinophils Relative: 2 % (ref 0–5)
HCT: 35.7 % — ABNORMAL LOW (ref 36.0–46.0)
MCH: 28.6 pg (ref 26.0–34.0)
MCHC: 32.2 g/dL (ref 30.0–36.0)
MCV: 88.8 fL (ref 78.0–100.0)
Monocytes Absolute: 0.9 10*3/uL (ref 0.1–1.0)
Platelets: 437 10*3/uL — ABNORMAL HIGH (ref 150–400)
RDW: 14 % (ref 11.5–15.5)
Rh Type: POSITIVE

## 2011-09-27 LAB — HEMOGLOBINOPATHY EVALUATION
Hgb A: 97.6 % (ref 96.8–97.8)
Hgb F Quant: 0 % (ref 0.0–2.0)
Hgb S Quant: 0 %

## 2011-10-07 ENCOUNTER — Other Ambulatory Visit: Payer: Self-pay | Admitting: Obstetrics and Gynecology

## 2011-10-07 ENCOUNTER — Encounter: Payer: Self-pay | Admitting: Obstetrics and Gynecology

## 2011-10-07 NOTE — Progress Notes (Signed)
Pt presented for NOB interview but unable to be seen due to insurance.  Per K. Marina Goodell pt states needs RF for BP med.  Was prescribed in [facility]U.  Chart reviewed.  Pt was given RX for Labetalol 09/13/11 with 1 RF.  Spoke with [facility] at [employer].  Pt does have RF's remaining.  Pt has been rescheduled for 10/30/11.  BP at visit 09/25/11 was 110/80. Consult with [facility].   Pt to keep appt as scheduled.  Pt left before able to inform of above info.  TC to pt.  Informed of information. Pt verbalizes comprehension.

## 2011-10-08 ENCOUNTER — Encounter: Payer: Self-pay | Admitting: Obstetrics and Gynecology

## 2011-10-18 ENCOUNTER — Encounter: Payer: Self-pay | Admitting: Obstetrics and Gynecology

## 2011-10-30 ENCOUNTER — Ambulatory Visit (INDEPENDENT_AMBULATORY_CARE_PROVIDER_SITE_OTHER): Payer: Medicaid Other | Admitting: Obstetrics and Gynecology

## 2011-10-30 ENCOUNTER — Other Ambulatory Visit (INDEPENDENT_AMBULATORY_CARE_PROVIDER_SITE_OTHER): Payer: Medicaid Other | Admitting: Obstetrics and Gynecology

## 2011-10-30 DIAGNOSIS — Z331 Pregnant state, incidental: Secondary | ICD-10-CM

## 2011-10-30 DIAGNOSIS — Z36 Encounter for antenatal screening of mother: Secondary | ICD-10-CM

## 2011-10-30 LAB — POCT URINALYSIS DIPSTICK
Blood, UA: 50
Glucose, UA: NEGATIVE
Ketones, UA: NEGATIVE
Spec Grav, UA: 1.02
Urobilinogen, UA: NEGATIVE

## 2011-11-01 LAB — CULTURE, OB URINE
Colony Count: NO GROWTH
Organism ID, Bacteria: NO GROWTH

## 2011-11-04 ENCOUNTER — Encounter: Payer: Self-pay | Admitting: Obstetrics and Gynecology

## 2011-11-04 ENCOUNTER — Ambulatory Visit (INDEPENDENT_AMBULATORY_CARE_PROVIDER_SITE_OTHER): Payer: Medicaid Other | Admitting: Obstetrics and Gynecology

## 2011-11-04 ENCOUNTER — Telehealth: Payer: Self-pay | Admitting: Obstetrics and Gynecology

## 2011-11-04 VITALS — BP 90/62 | Wt 189.0 lb

## 2011-11-04 DIAGNOSIS — O09529 Supervision of elderly multigravida, unspecified trimester: Secondary | ICD-10-CM | POA: Insufficient documentation

## 2011-11-04 DIAGNOSIS — L0293 Carbuncle, unspecified: Secondary | ICD-10-CM

## 2011-11-04 DIAGNOSIS — O10019 Pre-existing essential hypertension complicating pregnancy, unspecified trimester: Secondary | ICD-10-CM

## 2011-11-04 DIAGNOSIS — Z331 Pregnant state, incidental: Secondary | ICD-10-CM

## 2011-11-04 DIAGNOSIS — Z124 Encounter for screening for malignant neoplasm of cervix: Secondary | ICD-10-CM

## 2011-11-04 DIAGNOSIS — O10919 Unspecified pre-existing hypertension complicating pregnancy, unspecified trimester: Secondary | ICD-10-CM

## 2011-11-04 DIAGNOSIS — L0292 Furuncle, unspecified: Secondary | ICD-10-CM

## 2011-11-04 LAB — POCT WET PREP (WET MOUNT)
Clue Cells Wet Prep Whiff POC: NEGATIVE
Trichomonas Wet Prep HPF POC: NEGATIVE
WBC, Wet Prep HPF POC: NEGATIVE

## 2011-11-04 LAB — POCT URINALYSIS DIPSTICK
Bilirubin, UA: NEGATIVE
Blood, UA: NEGATIVE
Glucose, UA: NEGATIVE
Nitrite, UA: NEGATIVE
Urobilinogen, UA: NEGATIVE

## 2011-11-04 MED ORDER — CEPHALEXIN 500 MG PO CAPS: 500.0000 mg | ORAL_CAPSULE | Freq: Four times a day (QID) | ORAL | Status: AC

## 2011-11-04 NOTE — Progress Notes (Signed)
Pt agrees to genetic screenings 1st trimester screening is scheduled tomorrow @ 9:30.

## 2011-11-04 NOTE — Addendum Note (Signed)
Addended by: Janeece Agee on: 11/04/2011 02:56 PM   Modules accepted: Orders

## 2011-11-04 NOTE — Progress Notes (Signed)
Danielle [name] is being seen today for her first obstetrical visit.  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  Patient Active Problem List  Diagnosis  . Chronic hypertension in pregnancy  . Vaginal bleeding in pregnancy, first trimester  . Proteinuria  . Hx of preeclampsia, prior pregnancy, currently pregnant  . Recurrent boils  . AMA (advanced maternal age) multigravida 35+    She is at [redacted]w[redacted]d gestation by LMP and congruent with 1st trimester [place]. She wants genetic screening, but declines amnio. Has 1st trimester screen scheduled tomorrow. On Labetalol 200 mg po BID.  Her obstetrical history is significant for Patient Active Problem List  Diagnosis  . Chronic hypertension in pregnancy  . Vaginal bleeding in pregnancy, first trimester  . Proteinuria  . Hx of preeclampsia, prior pregnancy, currently pregnant  . Recurrent boils  . AMA (advanced maternal age) multigravida 35+      Relationship with FOB:   Patient does intend to breast feed.  Pregnancy history fully reviewed.   The following portions of the patient's history were reviewed and updated as appropriate: allergies, current medications, past family history, past medical history, past social history, past surgical history and problem list.  Review of Systems Pertinent ROS is described in HPI   Objective:   BP 90/62  Wt 189 lb (85.73 kg)  LMP 07/28/2011  Breastfeeding? Unknown Wt Readings from Last 1 Encounters:  11/04/11 189 lb (85.73 kg)   BMI: There is no height on file to calculate BMI.  General: alert, cooperative and no distress Respiratory: clear to auscultation bilaterally Cardiovascular: regular rate and rhythm, S1, S2 normal, no murmur Gastrointestinal: soft, non-tender; no masses,  no organomegaly Extremities: extremities normal, no pain or edema Vaginal Bleeding: none  EXTERNAL GENITALIA: normal appearing vulva  with no masses, tenderness or lesions VAGINA: no abnormal discharge or lesions CERVIX: no lesions or cervical motion tenderness UTERUS: gravid and consistent with 13 weeks ADNEXA: no masses palpable and nontender OB EXAM PELVIMETRY: appears adequate  Right axilla--draining hydranitis lesion, with area of induration approx 7 cm by 4 cm. Wound culture done.   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  Rx Keflex 500 mg po QID x 7 days Warm soaks to axilla Antibacterial soap to wash axillas Wound culture of area Do 24 hour urine within the next week, for protein, creatnine, and creatnine clearance (bring on Monday, 7/8). Needs CBC, CMP, LDH, Uric acid that day. Keep scheduled appointment for 1st trimester screen tomorrow. May need nephrologist consult if significant proteinuria.   Nigel Bridgeman CNM, [place] 11/04/2011 2:13 PM

## 2011-11-05 ENCOUNTER — Ambulatory Visit (INDEPENDENT_AMBULATORY_CARE_PROVIDER_SITE_OTHER): Payer: Medicaid Other

## 2011-11-05 DIAGNOSIS — Z36 Encounter for antenatal screening of mother: Secondary | ICD-10-CM

## 2011-11-05 LAB — PAP IG W/ RFLX HPV ASCU

## 2011-11-05 NOTE — Telephone Encounter (Signed)
Pt informed rx sent to pharm.  

## 2011-11-05 NOTE — Telephone Encounter (Signed)
Lm on vm tcb rgd msg 

## 2011-11-06 ENCOUNTER — Other Ambulatory Visit: Payer: Self-pay

## 2011-11-06 DIAGNOSIS — Z36 Encounter for antenatal screening of mother: Secondary | ICD-10-CM

## 2011-11-08 LAB — US OB COMP LESS 14 WKS

## 2011-11-11 ENCOUNTER — Other Ambulatory Visit: Payer: Medicaid Other

## 2011-11-11 DIAGNOSIS — O10919 Unspecified pre-existing hypertension complicating pregnancy, unspecified trimester: Secondary | ICD-10-CM

## 2011-11-11 LAB — CBC
HCT: 33.5 % — ABNORMAL LOW (ref 36.0–46.0)
MCHC: 33.4 g/dL (ref 30.0–36.0)
Platelets: 424 10*3/uL — ABNORMAL HIGH (ref 150–400)
RDW: 14.3 % (ref 11.5–15.5)

## 2011-11-11 LAB — COMPREHENSIVE METABOLIC PANEL
ALT: 19 U/L (ref 0–35)
Albumin: 3.6 g/dL (ref 3.5–5.2)
CO2: 23 mEq/L (ref 19–32)
Chloride: 107 mEq/L (ref 96–112)
Glucose, Bld: 90 mg/dL (ref 70–99)
Potassium: 4.8 mEq/L (ref 3.5–5.3)
Sodium: 139 mEq/L (ref 135–145)
Total Bilirubin: 0.2 mg/dL — ABNORMAL LOW (ref 0.3–1.2)
Total Protein: 6.4 g/dL (ref 6.0–8.3)

## 2011-11-11 LAB — CREATININE CLEARANCE, URINE, 24 HOUR: Creatinine Clearance: 229 mL/min — ABNORMAL HIGH (ref 75–115)

## 2011-11-11 LAB — LACTATE DEHYDROGENASE: LDH: 107 U/L (ref 94–250)

## 2011-11-11 LAB — URIC ACID: Uric Acid, Serum: 4 mg/dL (ref 2.4–7.0)

## 2011-11-22 ENCOUNTER — Telehealth: Payer: Self-pay | Admitting: Obstetrics and Gynecology

## 2011-11-22 NOTE — Telephone Encounter (Signed)
Tc to pt per telephone call. Pt c/o upper abd pain x 2days. Pt has increased pain with last bowel movement in that area. Pt states,"feels has heartburn and tried Tums without improvement".  No fever. No vaginal bleeding. Pt denies constipation. No NVD. Informed pt to increase water intake(64oz) daily, try OTC Zantac, Prilosec,or Pepcid, and Gas X. Told pt if no improvement, to call back after hours and/or over the weekend. Pt voices understanding.

## 2011-11-22 NOTE — Telephone Encounter (Signed)
Triage/ob °

## 2011-12-02 ENCOUNTER — Encounter: Payer: Self-pay | Admitting: Obstetrics and Gynecology

## 2011-12-02 ENCOUNTER — Ambulatory Visit (INDEPENDENT_AMBULATORY_CARE_PROVIDER_SITE_OTHER): Payer: Medicaid Other | Admitting: Obstetrics and Gynecology

## 2011-12-02 VITALS — BP 118/80 | Wt 191.0 lb

## 2011-12-02 DIAGNOSIS — O10019 Pre-existing essential hypertension complicating pregnancy, unspecified trimester: Secondary | ICD-10-CM

## 2011-12-02 DIAGNOSIS — O10919 Unspecified pre-existing hypertension complicating pregnancy, unspecified trimester: Secondary | ICD-10-CM

## 2011-12-02 DIAGNOSIS — Z331 Pregnant state, incidental: Secondary | ICD-10-CM

## 2011-12-02 DIAGNOSIS — L732 Hidradenitis suppurativa: Secondary | ICD-10-CM | POA: Insufficient documentation

## 2011-12-02 DIAGNOSIS — R5383 Other fatigue: Secondary | ICD-10-CM

## 2011-12-02 DIAGNOSIS — Z3689 Encounter for other specified antenatal screening: Secondary | ICD-10-CM

## 2011-12-02 LAB — TSH: TSH: 0.613 u[IU]/mL (ref 0.350–4.500)

## 2011-12-02 NOTE — Progress Notes (Signed)
Pt states she is no longer taking prenatal vitamins because they make her feel sick and would like to know if she can take a children's vitamin. OK to take 2 Flintstones daily C/O fatigue.  Rec: TSH    CBC    Vit D AFP today.  1st trimester screen nl Reviewed 24 hr urine with 50mg  prot/24 hr. Anatomy US NV

## 2011-12-03 LAB — VITAMIN D 25 HYDROXY (VIT D DEFICIENCY, FRACTURES): Vit D, 25-Hydroxy: 25 ng/mL — ABNORMAL LOW (ref 30–89)

## 2011-12-03 LAB — ALPHA FETOPROTEIN, MATERNAL
Open Spina bifida: NEGATIVE
Osb Risk: 1:21900 {titer}

## 2011-12-11 ENCOUNTER — Telehealth: Payer: Self-pay

## 2011-12-11 NOTE — Telephone Encounter (Signed)
Tc to pt per test results. Told pt Vit D=25. Pt needs Vit D OTC 4,000IU daily per [facility]. Pt voices understanding. Pt aware TSH/CBC-wnl and AFP-negative.

## 2011-12-16 ENCOUNTER — Telehealth: Payer: Self-pay | Admitting: Obstetrics and Gynecology

## 2011-12-16 NOTE — Telephone Encounter (Signed)
Tc to pt regarding msg.  Pt states that she is feeling better now, but earlier vomitted this am, had a loss of appetite and has been having frequency she thinks w/ urination but denies any pain, no fever or any other urinary sxs.  Pt states b/c she feels a little better she will continue monitoring.  Has an appt on Monday for ROB.  Pt advised to push fluids and try to eat small meals Q hr even if she is not that hungry b/c of need for baby's nutrition and hers as well, pt voices understanding, will call with any concerns.

## 2011-12-16 NOTE — Telephone Encounter (Signed)
TRIAGE/OB/EPIC °

## 2011-12-23 ENCOUNTER — Telehealth: Payer: Self-pay | Admitting: Obstetrics and Gynecology

## 2011-12-23 ENCOUNTER — Encounter: Payer: Self-pay | Admitting: Obstetrics and Gynecology

## 2011-12-23 ENCOUNTER — Ambulatory Visit (INDEPENDENT_AMBULATORY_CARE_PROVIDER_SITE_OTHER): Payer: Medicaid Other | Admitting: Obstetrics and Gynecology

## 2011-12-23 ENCOUNTER — Ambulatory Visit (INDEPENDENT_AMBULATORY_CARE_PROVIDER_SITE_OTHER): Payer: Medicaid Other

## 2011-12-23 VITALS — BP 118/76 | Wt 196.0 lb

## 2011-12-23 DIAGNOSIS — Z3689 Encounter for other specified antenatal screening: Secondary | ICD-10-CM

## 2011-12-23 LAB — US OB COMP + 14 WK

## 2011-12-23 NOTE — Telephone Encounter (Signed)
Tc to pt regarding msg below.  Pt voices understanding to BP dosage change and informed that a new Rx has been sent to pharmacy.  Pt voices understanding.

## 2011-12-23 NOTE — Progress Notes (Signed)
Doing well, except PNVs causing ? Gall bladder sx. Can't take PNV--hx "cyst on gall bladder" in past. Will stop PNV and observe sx RTO in 2 weeks to review status and determine if any f/u needed on gall bladder issues. [place] today--Female (!) (hx 3 previous girls), 19 6/7 weeks c/w dates, posterior placenta, normal anatomy. Hypercoiled umbilical cord. Simple cyst in Left ovary--2.5x 2.4x2.7 Will plan USs for growth q 4 weeks from 28 weeks on due to chronic hypertension. Will consult with [facility] regarding antenatal testing plan and need for any further testing re:  Gallbladder issue. On Labetalol 200 mg po q day. AFP WNL.

## 2011-12-23 NOTE — Progress Notes (Signed)
C/o PNV's causing hypergastric pain; pt suspects gallbladder  12/02/11 TSH 0.613 Hemoglobin 11.3 Vit D 25 - Declines Vit D supplements afraid they may cause abd. pain

## 2011-12-23 NOTE — Progress Notes (Signed)
Correction: Patient should be taking 300 mg po BID. Will have triage call patient and confirm how she is taking Labetalol. Plan NST 2x/week or weekly BPP starting at 32 weeks unless abnormal growth. Repeat PIH labs if BP > 140/90 on meds.

## 2011-12-23 NOTE — Telephone Encounter (Signed)
Message copied by Delon Sacramento on Mon Dec 23, 2011  4:54 PM ------      Message from: Cornelius Moras      Created: Mon Dec 23, 2011  4:04 PM      Regarding: BP med change       Per [facility], increase Labetalol to 300 mg po BID.      Currently on 200 q day per patient report.      I have sent new Rx to her pharmacy.            Thanks!      VL

## 2012-01-07 ENCOUNTER — Ambulatory Visit (INDEPENDENT_AMBULATORY_CARE_PROVIDER_SITE_OTHER): Payer: Medicaid Other | Admitting: Obstetrics and Gynecology

## 2012-01-07 ENCOUNTER — Encounter: Payer: Self-pay | Admitting: Obstetrics and Gynecology

## 2012-01-07 VITALS — BP 112/66 | Wt 195.0 lb

## 2012-01-07 DIAGNOSIS — IMO0001 Reserved for inherently not codable concepts without codable children: Secondary | ICD-10-CM

## 2012-01-07 DIAGNOSIS — O09529 Supervision of elderly multigravida, unspecified trimester: Secondary | ICD-10-CM

## 2012-01-07 DIAGNOSIS — O169 Unspecified maternal hypertension, unspecified trimester: Secondary | ICD-10-CM

## 2012-01-07 DIAGNOSIS — O139 Gestational [pregnancy-induced] hypertension without significant proteinuria, unspecified trimester: Secondary | ICD-10-CM

## 2012-01-07 NOTE — Progress Notes (Signed)
[redacted]w[redacted]d Pt c/o being "sore" in her hips and also c/o not being able to sleep.

## 2012-01-07 NOTE — Patient Instructions (Signed)
Patient Education Materials to be provided at check out (*indicates is located in accordion folder):  Easing Back Pain During Pregnancy  

## 2012-01-07 NOTE — Progress Notes (Signed)
[redacted]w[redacted]d CHTN patient has decreased to Labetalol 300 mg daily for the last week because was very light-headed: no symptoms of PIH Pt takes BP at home: instructed to call if >= 140/90 C/o hip pain at night: suggest pillow between knees and ACOG booklet given

## 2012-02-03 ENCOUNTER — Telehealth: Payer: Self-pay | Admitting: Obstetrics and Gynecology

## 2012-02-03 NOTE — Telephone Encounter (Signed)
Took call from pt, pt states that yesterday BP was 137/102 but she had not taken her medication as instructed. Pt states this morning BP was close to the same reading as yesterday (137/102) but she still had not taken medication as directed.  After taking medication as directed BP was 102/60 and pt thought this may be too low. Advised pt to continue to take medication as directed by provider at her last appointment. Also advised pt to check BP again before lunch and to call office back in order to determine if she needed to be evaluated by provider in office. Pt voiced understanding.

## 2012-02-04 ENCOUNTER — Telehealth: Payer: Self-pay | Admitting: Obstetrics and Gynecology

## 2012-02-04 ENCOUNTER — Ambulatory Visit (INDEPENDENT_AMBULATORY_CARE_PROVIDER_SITE_OTHER): Payer: Medicaid Other | Admitting: Obstetrics and Gynecology

## 2012-02-04 ENCOUNTER — Other Ambulatory Visit: Payer: Self-pay | Admitting: Obstetrics and Gynecology

## 2012-02-04 ENCOUNTER — Ambulatory Visit (INDEPENDENT_AMBULATORY_CARE_PROVIDER_SITE_OTHER): Payer: Medicaid Other

## 2012-02-04 ENCOUNTER — Encounter: Payer: Self-pay | Admitting: Obstetrics and Gynecology

## 2012-02-04 VITALS — BP 118/68 | Wt 199.0 lb

## 2012-02-04 DIAGNOSIS — R300 Dysuria: Secondary | ICD-10-CM

## 2012-02-04 DIAGNOSIS — O26899 Other specified pregnancy related conditions, unspecified trimester: Secondary | ICD-10-CM

## 2012-02-04 DIAGNOSIS — O10919 Unspecified pre-existing hypertension complicating pregnancy, unspecified trimester: Secondary | ICD-10-CM

## 2012-02-04 DIAGNOSIS — O169 Unspecified maternal hypertension, unspecified trimester: Secondary | ICD-10-CM

## 2012-02-04 DIAGNOSIS — O139 Gestational [pregnancy-induced] hypertension without significant proteinuria, unspecified trimester: Secondary | ICD-10-CM

## 2012-02-04 DIAGNOSIS — J45909 Unspecified asthma, uncomplicated: Secondary | ICD-10-CM

## 2012-02-04 DIAGNOSIS — O10019 Pre-existing essential hypertension complicating pregnancy, unspecified trimester: Secondary | ICD-10-CM

## 2012-02-04 LAB — CBC
HCT: 31.2 % — ABNORMAL LOW (ref 36.0–46.0)
MCV: 88.9 fL (ref 78.0–100.0)
RBC: 3.51 MIL/uL — ABNORMAL LOW (ref 3.87–5.11)
WBC: 12.2 10*3/uL — ABNORMAL HIGH (ref 4.0–10.5)

## 2012-02-04 LAB — POCT URINALYSIS DIPSTICK
Glucose, UA: NEGATIVE
Spec Grav, UA: 1.015

## 2012-02-04 LAB — COMPREHENSIVE METABOLIC PANEL
BUN: 7 mg/dL (ref 6–23)
CO2: 24 mEq/L (ref 19–32)
Creat: 0.66 mg/dL (ref 0.50–1.10)
Glucose, Bld: 93 mg/dL (ref 70–99)
Total Bilirubin: 0.2 mg/dL — ABNORMAL LOW (ref 0.3–1.2)

## 2012-02-04 LAB — US OB FOLLOW UP

## 2012-02-04 LAB — LACTATE DEHYDROGENASE: LDH: 112 U/L (ref 94–250)

## 2012-02-04 MED ORDER — NITROFURANTOIN MONOHYD MACRO 100 MG PO CAPS: 100.0000 mg | ORAL_CAPSULE | Freq: Two times a day (BID) | ORAL | Status: AC

## 2012-02-04 MED ORDER — LABETALOL HCL 100 MG PO TABS: 100.0000 mg | ORAL_TABLET | ORAL | Status: DC

## 2012-02-04 MED ORDER — ALBUTEROL SULFATE HFA 108 (90 BASE) MCG/ACT IN AERS: 2.0000 | INHALATION_SPRAY | Freq: Four times a day (QID) | RESPIRATORY_TRACT | Status: AC | PRN

## 2012-02-04 NOTE — Progress Notes (Signed)
[redacted]w[redacted]d Pt states 1.5 Labetalol brings down BP too low. One tab brings it down but in the afternoon,  BP is elevated again. Pt c/o cramping feeling before voiding and after voiding.

## 2012-02-04 NOTE — Progress Notes (Signed)
Having issues with highs and lows in BP--not taking BP regularly, just when feels bad. BP is sometimes elevated in pm, sometimes in am, but also low at times. No HA, visual symptoms, or epigastric pain. UTI symptoms--small ketones, 3+ protein.  Urine to culture. Last 24 hour urine at leas 2 months ago per pt report Needs new Rx for Albuterol inhaler--no current attacks [place] today:  25 5/7 weeks, c/w dates, 1+14 EFW, 56%ile. AFV WNL.  Cervix 3.24.  Vtx, posterior placenta Rx Macrobid 1 po BID x 7 days due to dysuria--may be due to concentrated urine. Consulted with SR re:  Labetalol dosing. Plan:  100 mg in am, 300 mg at mid-day, 200 mg at night (take 8a,2p,8p) Patient to check BP QID x 1 week and document for review at visit next week with MD. New Labetalol Rx sent to patient's pharmacy (called in, would not e-prescribe) 24 hour urine to start today, with PIH labs today. To return urine tomorrow. [place] q 4 weeks for growth due to chronic hypertension.

## 2012-02-04 NOTE — Telephone Encounter (Signed)
Tc to pt regarding msg below.  Per documentation in e-prescribe, quantity is #90 w/ 1 RF, spoke w/ Kathlene November, voices understanding.

## 2012-02-04 NOTE — Telephone Encounter (Signed)
VM from pharmacy. Rx for Labetolol called with directions but no quantity and no RF.  [facility]  [phone]

## 2012-02-05 ENCOUNTER — Other Ambulatory Visit: Payer: Self-pay

## 2012-02-05 ENCOUNTER — Other Ambulatory Visit: Payer: Medicaid Other

## 2012-02-05 DIAGNOSIS — O10919 Unspecified pre-existing hypertension complicating pregnancy, unspecified trimester: Secondary | ICD-10-CM

## 2012-02-05 DIAGNOSIS — O139 Gestational [pregnancy-induced] hypertension without significant proteinuria, unspecified trimester: Secondary | ICD-10-CM

## 2012-02-06 ENCOUNTER — Encounter: Payer: Self-pay | Admitting: Obstetrics and Gynecology

## 2012-02-06 LAB — CREATININE, URINE, 24 HOUR
Creatinine, 24H Ur: 1560 mg/d (ref 700–1800)
Creatinine, Urine: 260 mg/dL

## 2012-02-06 LAB — PROTEIN, URINE, 24 HOUR: Protein, Urine: 63 mg/dL

## 2012-02-07 ENCOUNTER — Other Ambulatory Visit: Payer: Self-pay

## 2012-02-07 ENCOUNTER — Telehealth: Payer: Self-pay

## 2012-02-07 DIAGNOSIS — Z331 Pregnant state, incidental: Secondary | ICD-10-CM

## 2012-02-07 NOTE — Telephone Encounter (Signed)
Message copied by Janeece Agee on Fri Feb 07, 2012  4:09 PM ------      Message from: Cornelius Moras      Created: Thu Feb 06, 2012  9:30 PM      Regarding: Lab order for patient on 10/8       This patient needs ANA titer drawn at her visit on 10/8 with AR.      Please make sure this gets ordered!      Thanks!      VL

## 2012-02-11 ENCOUNTER — Ambulatory Visit (INDEPENDENT_AMBULATORY_CARE_PROVIDER_SITE_OTHER): Payer: Medicaid Other | Admitting: Obstetrics and Gynecology

## 2012-02-11 ENCOUNTER — Other Ambulatory Visit: Payer: Self-pay | Admitting: Obstetrics and Gynecology

## 2012-02-11 VITALS — BP 104/70 | Wt 201.0 lb

## 2012-02-11 DIAGNOSIS — O26839 Pregnancy related renal disease, unspecified trimester: Secondary | ICD-10-CM

## 2012-02-11 DIAGNOSIS — O169 Unspecified maternal hypertension, unspecified trimester: Secondary | ICD-10-CM

## 2012-02-11 DIAGNOSIS — O1210 Gestational proteinuria, unspecified trimester: Secondary | ICD-10-CM

## 2012-02-11 LAB — COMPREHENSIVE METABOLIC PANEL
Albumin: 3.5 g/dL (ref 3.5–5.2)
CO2: 23 mEq/L (ref 19–32)
Glucose, Bld: 77 mg/dL (ref 70–99)
Sodium: 138 mEq/L (ref 135–145)
Total Bilirubin: 0.3 mg/dL (ref 0.3–1.2)
Total Protein: 6.2 g/dL (ref 6.0–8.3)

## 2012-02-11 LAB — LACTATE DEHYDROGENASE: LDH: 88 U/L — ABNORMAL LOW (ref 94–250)

## 2012-02-11 LAB — CBC
MCH: 29.1 pg (ref 26.0–34.0)
MCHC: 31.9 g/dL (ref 30.0–36.0)
MCV: 91.1 fL (ref 78.0–100.0)
Platelets: 375 10*3/uL (ref 150–400)

## 2012-02-11 LAB — URIC ACID: Uric Acid, Serum: 3.4 mg/dL (ref 2.4–7.0)

## 2012-02-11 NOTE — Progress Notes (Signed)
[redacted]w[redacted]d 24hr urine >300 mg protein Pt c/o floaters occas but BP is good She denies HA or other sxs Refer to MFM for rec - appt sched for thurs Check ANA and repeat gest htn labs today Urine cx RTO 1wk

## 2012-02-13 ENCOUNTER — Ambulatory Visit (HOSPITAL_COMMUNITY)
Admission: RE | Admit: 2012-02-13 | Discharge: 2012-02-13 | Disposition: A | Discharge status: Home / Self Care | Payer: Medicaid Other | Source: Ambulatory Visit | Attending: Obstetrics and Gynecology | Admitting: Obstetrics and Gynecology

## 2012-02-13 DIAGNOSIS — R809 Proteinuria, unspecified: Secondary | ICD-10-CM

## 2012-02-13 DIAGNOSIS — O10019 Pre-existing essential hypertension complicating pregnancy, unspecified trimester: Secondary | ICD-10-CM | POA: Insufficient documentation

## 2012-02-13 DIAGNOSIS — O10919 Unspecified pre-existing hypertension complicating pregnancy, unspecified trimester: Secondary | ICD-10-CM

## 2012-02-13 DIAGNOSIS — O09529 Supervision of elderly multigravida, unspecified trimester: Secondary | ICD-10-CM | POA: Insufficient documentation

## 2012-02-13 DIAGNOSIS — O09299 Supervision of pregnancy with other poor reproductive or obstetric history, unspecified trimester: Secondary | ICD-10-CM | POA: Insufficient documentation

## 2012-02-13 DIAGNOSIS — O358XX0 Maternal care for other (suspected) fetal abnormality and damage, not applicable or unspecified: Secondary | ICD-10-CM | POA: Insufficient documentation

## 2012-02-13 DIAGNOSIS — L0293 Carbuncle, unspecified: Secondary | ICD-10-CM

## 2012-02-13 DIAGNOSIS — Z363 Encounter for antenatal screening for malformations: Secondary | ICD-10-CM | POA: Insufficient documentation

## 2012-02-13 DIAGNOSIS — O169 Unspecified maternal hypertension, unspecified trimester: Secondary | ICD-10-CM

## 2012-02-13 DIAGNOSIS — Z1389 Encounter for screening for other disorder: Secondary | ICD-10-CM | POA: Insufficient documentation

## 2012-02-13 NOTE — Progress Notes (Addendum)
Patient : Danielle [name]; 36 y.o. MRN# [id number] [facility] Attending: Purcell Nails, MD Consult Date: 02/13/2012   Consult for: Danielle [name]  HPI: [name] is a 36 y.o. [id number] at [redacted]w[redacted]d with a singleton fetus.  She presents for consultation with Maternal-Fetal medicine to evaluate and provide recommendations in regard to management of her CHTN in pregnancy.  A relevant discussion and recommendations follows.  Allergy: Penicillins Patient denies food, latex or environmental allergies.   Current Medications: Current Outpatient Prescriptions on File Prior to Encounter  Medication Sig Dispense Refill  . Acetaminophen (TYLENOL PO) Take by mouth as needed.      Marland Kitchen albuterol (PROVENTIL HFA;VENTOLIN HFA) 108 (90 BASE) MCG/ACT inhaler Inhale 2 puffs into the lungs every 6 (six) hours as needed. Shortness of breath  1 Inhaler  4  . labetalol (NORMODYNE) 100 MG tablet Take 1 tablet (100 mg total) by mouth as directed. 1 tablet in the morning, 3 tablets at mid-day, and 2 tablets at night. (8 am, 2pm, 8pm)  90 tablet  2  . labetalol (NORMODYNE) 100 MG tablet Take 1 tablet (100 mg total) by mouth as directed.  90 tablet  1  . Prenatal Vitamins (DIS) TABS Take 1 each by mouth 1 day or 1 dose.  30 each  prn  . promethazine (PHENERGAN) 25 MG tablet Take 1 tablet (25 mg total) by mouth every 6 (six) hours as needed for nausea.  30 tablet  1  . Simethicone (GAS-X PO) Take by mouth.         Past Medical History: Past Medical History  Diagnosis Date  . Trichimoniasis   . Yeast infection   . Preterm labor 1995  . Anemia     WITH PREGNANCY  . Asthma 08/2011    ALBUTEROL  INHALER PRN  . Infection     UTI X 1  . Headache     MIGRAINES RESOLVED  . Arm fracture AGE 9  . Hypertension 2001  . Pregnancy induced hypertension     Past Surgical History: Past Surgical History  Procedure Date  . Wisdom tooth extraction      Past OB/GYN History: OB History    Grav  Para Term Preterm Abortions TAB SAB Ect Mult Living   5 4 4  0      4     1. 1992 SVD female 7lb 8 oz at 40wks 2. 1995 SVD female 7lb 6 oz at 39+ wks 3. 2001 SVD female 6 lb 3 oz at 39+ wks 4. 2004 SVD female IOL at 37 wks for Preeclampsia, 6 lb 4 oz 5. Current pregnancy  Social History: History   Social History  . Marital Status: Married    Spouse Name: N/A    Number of Children: N/A  . Years of Education: N/A   Social History Main Topics  . Smoking status: Current Every Day Smoker -- 0.2 packs/day for 20 years    Types: Cigarettes  . Smokeless tobacco: Never Used   Comment: HAS DECREASED FROM 1-1 1/2 PACKS /DAY  . Alcohol Use: No     occ prior to pregnancy  . Drug Use: No  . Sexually Active: Yes    Birth Control/ Protection: None     currently pregnant   Other Topics Concern  . Not on file   Social History Narrative  . No narrative on file    Family History: Family History  Problem Relation Age of Onset  . Diabetes Mother   .  Hypertension Mother   . Drug abuse Mother   . Hypertension Sister   . Heart disease Maternal Grandfather   . Diabetes Maternal Grandfather   . Hypertension Maternal Grandfather   . Diabetes Paternal Aunt   . Cancer Paternal Grandmother   . Stroke Paternal Grandfather   . Hypertension Paternal Grandfather   . Cancer Paternal Grandfather      Review of Systems: A comprehensive review of systems was negative.   Physical Exam: Gen: well-appearing, NAD, pleasant Neuro:  Grossly intact without focal deficits Abd:  Gravid Ext:  No edema grossly  Ms. Mulvaney had an ultrasound appointment today.    OB US Exam Comments An active singleton fetus is observed.  Biometry is appropriate for gestational age.  Amniotic fluid volume is normal.  Screening survey of the fetal anatomy was performed and no dysmorphic features are detected.  OB US Exam Impression Active singleton fetus Normal anatomic survey Size is appropriate for dates  (normal growth)  Please see AS-OB/GYN report for details. Labs: 24 hour urine 368mg  protein  Discussion:  By way of consultation, I spoke to Ms. August Luz about the risks of hypertension in pregnancy.  I reviewed hypertension as a cause of uteroplacental insufficiency, with increased risk of IUGR, oligohydramnios, and stillbirth.  I told her that her hypertension also places her at increased risk for preeclampsia, describing the triad of increased blood pressure, proteinuria, and abnormal edema.  Finally, I spoke about hypertension increasing the risk of placental abruption, especially in the setting of superimposed preeclampsia.  We talked about the medical treatment of hypertension in pregnancy.  I outlined the different classes of medications, emphasizing that angiotensin enzyme inhibitors and angoitensin receptor blockers are contraindicated, and diuretics are relatively contraindicated.  I told her that beta-blockers and calcium channel blockers are commonly used to treat hypertension in pregnancy, and that both are felt to be safe for use in pregnancy.  A second agent may be added if Labetalol dosing reaches maximum daily recommended safe dose.  Hence, I recommend staying with one agent until that is the case.  Finally, I outlined the usual plan of management for hypertension in pregnancy.  She should have her blood pressure carefully followed, and her medications adjusted to keep her BP in the target range of around 130-140/80-90 mm/Hg.   I reviewed her 24-hour urine collection for total protein and creatinine clearance as demonstrating mild proteinuria; this may be used as both a screen for underlying renal damage and as a baseline for later evaluation in the event of suspected superimposed pre-eclampsia.  If she has a two- to three-fold increase in proteinuria and has concurrent clinical signs and symptoms of preeclampsia, she should be managed as having preeclampsia.  Serial  ultrasounds are recommended every four weeks thereafter to follow fetal growth.  At around 32 weeks, twice weekly non-stress tests with weekly amniotic fluid volume measurement should be initiated.  Finally, assuming all goes well, she should be delivered at around [redacted] weeks gestation, if HTN is medically controlled, growth is AGA, AFI remains normal, testing is reassuring, and preeclampsia does not develop in the interim.   I additionally spoke to your patient in regard to her asthma.  She uses her inhaler only sporadically (once every few months) and appears to have mild asthma stratification.  I recommended that she check her peak flow weekly for baseline and also at time of exacerbation and 30 minutes after treatment.  She should bring these recorded measurements in for  review at the time of prenatal visits.  Since she did not have a peak flow meter, I prescribed one for her prior to departure from my office today.   Impressions:  MAHLIYAH ZAMBO is a 36 y.o. [id number] at [redacted]w[redacted]d - with well-controlled CHTN and baseline 24 hour urine demonstrating mild proteinuria.  Summary of Recommendations: I. HTN 1. Adjust Labetalol as clinically indicated over the course of this pregnancy; based upon my review of her records, I recommended to her today to increase her dose as follows:  Labetalol 200mg  at 8am (from 100mg ); 300mg  at 2pm (same); and 300mg  at 8pm (from 200mg ).   I recommend a review again in 1 week, and if home values remain elevated to increase to 300mg  po tid and to urge her to adjust her schedule to every 8 hours religiously,  2. Continue home BP monitoring 4 x daily with preeclamptic precautions given 3.  Mild proteinuria is a marker of underlying chronic, subclinical renal injury in association with chronic HTN; 4. Interval growth ultrasounds monthly; 5. Twice weekly NSTs and weekly AFIs beginning at [redacted] weeks gestation; 6. Delivery at [redacted] weeks gestation assuming HTN is medically controlled,  growth is AGA, AFI remains normal, testing is reassuring, and preeclampsia does not develop in the interim.  7. If any of the aforementioned exclusion criteria (recommendation #6) for expectant management are found prior to 39 weeks, I recommend induction/delivery as clinically warranted.  MFM would be happy to reconsult as felt necessary to help in the assessment and recommendations.  II. Asthma 1. Weekly peak flow meter measurement for baseline; 2. Check peak flow at time of needing albuterol and 30 minutes after treatment; 3.  Bring peak flow measurements and log of albuterol need to physician appointments 4. Prescription for peak flow meter was given.  Time Spent: I spent in excess of 60 minutes in consultation with this patient to review and evaluate her case, and provide her with an adequate discussion and education.  More than 50% of this time was spent in direct face-to-face counseling. It was a pleasure seeing your patient in the office today.  Thank you for consultation. Please do not hesitate to contact our service for any further questions.  I would consider it appropriate if desired for MFM to have a shared management of HTN during her monthly ultrasound visits but will leave this to your discretion.  Merideth Abbey, MD, MS, FACOG Assistant Professor, Maternal-Fetal Medicine

## 2012-02-13 NOTE — Progress Notes (Signed)
Patient : Danielle [name]; 36 y.o. MRN# [id number] [facility] Attending: Angela Y Roberts, MD Consult Date: 02/13/2012   Consult for: CHTN  HPI: [name] is a 36 y.o. G5P4004 at [redacted]w[redacted]d with a singleton fetus.  She presents for consultation with Maternal-Fetal medicine to evaluate and provide recommendations in regard to management of her CHTN in pregnancy.  A relevant discussion and recommendations follows.  Allergy: Penicillins Patient denies food, latex or environmental allergies.   Current Medications: Current Outpatient Prescriptions on File Prior to Encounter  Medication Sig Dispense Refill  . Acetaminophen (TYLENOL PO) Take by mouth as needed.      . albuterol (PROVENTIL HFA;VENTOLIN HFA) 108 (90 BASE) MCG/ACT inhaler Inhale 2 puffs into the lungs every 6 (six) hours as needed. Shortness of breath  1 Inhaler  4  . labetalol (NORMODYNE) 100 MG tablet Take 1 tablet (100 mg total) by mouth as directed. 1 tablet in the morning, 3 tablets at mid-day, and 2 tablets at night. (8 am, 2pm, 8pm)  90 tablet  2  . labetalol (NORMODYNE) 100 MG tablet Take 1 tablet (100 mg total) by mouth as directed.  90 tablet  1  . Prenatal Vitamins (DIS) TABS Take 1 each by mouth 1 day or 1 dose.  30 each  prn  . promethazine (PHENERGAN) 25 MG tablet Take 1 tablet (25 mg total) by mouth every 6 (six) hours as needed for nausea.  30 tablet  1  . Simethicone (GAS-X PO) Take by mouth.         Past Medical History: Past Medical History  Diagnosis Date  . Trichimoniasis   . Yeast infection   . Preterm labor 1995  . Anemia     WITH PREGNANCY  . Asthma 08/2011    ALBUTEROL  INHALER PRN  . Infection     UTI X 1  . Headache     MIGRAINES RESOLVED  . Arm fracture AGE 7  . Hypertension 2001  . Pregnancy induced hypertension     Past Surgical History: Past Surgical History  Procedure Date  . Wisdom tooth extraction      Past OB/GYN History: OB History    Grav  Para Term Preterm Abortions TAB SAB Ect Mult Living   5 4 4 0      4     1. 1992 SVD female 7lb 8 oz at 40wks 2. 1995 SVD female 7lb 6 oz at 39+ wks 3. 2001 SVD female 6 lb 3 oz at 39+ wks 4. 2004 SVD female IOL at 37 wks for Preeclampsia, 6 lb 4 oz 5. Current pregnancy  Social History: History   Social History  . Marital Status: Married    Spouse Name: N/A    Number of Children: N/A  . Years of Education: N/A   Social History Main Topics  . Smoking status: Current Every Day Smoker -- 0.2 packs/day for 20 years    Types: Cigarettes  . Smokeless tobacco: Never Used   Comment: HAS DECREASED FROM 1-1 1/2 PACKS /DAY  . Alcohol Use: No     occ prior to pregnancy  . Drug Use: No  . Sexually Active: Yes    Birth Control/ Protection: None     currently pregnant   Other Topics Concern  . Not on file   Social History Narrative  . No narrative on file    Family History: Family History  Problem Relation Age of Onset  . Diabetes Mother   .   Hypertension Mother   . Drug abuse Mother   . Hypertension Sister   . Heart disease Maternal Grandfather   . Diabetes Maternal Grandfather   . Hypertension Maternal Grandfather   . Diabetes Paternal Aunt   . Cancer Paternal Grandmother   . Stroke Paternal Grandfather   . Hypertension Paternal Grandfather   . Cancer Paternal Grandfather      Review of Systems: A comprehensive review of systems was negative.   Physical Exam: Gen: well-appearing, NAD, pleasant Neuro:  Grossly intact without focal deficits Abd:  Gravid Ext:  No edema grossly  Ms. Stehlik had an ultrasound appointment today.    OB US Exam Comments An active singleton fetus is observed.  Biometry is appropriate for gestational age.  Amniotic fluid volume is normal.  Screening survey of the fetal anatomy was performed and no dysmorphic features are detected.  OB US Exam Impression Active singleton fetus Normal anatomic survey Size is appropriate for dates  (normal growth)  Please see [facility]-OB/GYN report for details. Labs: 24 hour urine 368mg protein  Discussion:  By way of consultation, I spoke to [name] about the risks of hypertension in pregnancy.  I reviewed hypertension as a cause of uteroplacental insufficiency, with increased risk of IUGR, oligohydramnios, and stillbirth.  I told her that her hypertension also places her at increased risk for preeclampsia, describing the triad of increased blood pressure, proteinuria, and abnormal edema.  Finally, I spoke about hypertension increasing the risk of placental abruption, especially in the setting of superimposed preeclampsia.  We talked about the medical treatment of hypertension in pregnancy.  I outlined the different classes of medications, emphasizing that angiotensin enzyme inhibitors and angoitensin receptor blockers are contraindicated, and diuretics are relatively contraindicated.  I told her that beta-blockers and calcium channel blockers are commonly used to treat hypertension in pregnancy, and that both are felt to be safe for use in pregnancy.  A second agent may be added if Labetalol dosing reaches maximum daily recommended safe dose.  Hence, I recommend staying with one agent until that is the case.  Finally, I outlined the usual plan of management for hypertension in pregnancy.  She should have her blood pressure carefully followed, and her medications adjusted to keep her BP in the target range of around 130-140/80-90 mm/Hg.   I reviewed her 24-hour urine collection for total protein and creatinine clearance as demonstrating mild proteinuria; this may be used as both a screen for underlying renal damage and as a baseline for later evaluation in the event of suspected superimposed pre-eclampsia.  If she has a two- to three-fold increase in proteinuria and has concurrent clinical signs and symptoms of preeclampsia, she should be managed as having preeclampsia.  Serial  ultrasounds are recommended every four weeks thereafter to follow fetal growth.  At around 32 weeks, twice weekly non-stress tests with weekly amniotic fluid volume measurement should be initiated.  Finally, assuming all goes well, she should be delivered at around [redacted] weeks gestation, if HTN is medically controlled, growth is AGA, AFI remains normal, testing is reassuring, and preeclampsia does not develop in the interim.   I additionally spoke to your patient in regard to her asthma.  She uses her inhaler only sporadically (once every few months) and appears to have mild asthma stratification.  I recommended that she check her peak flow weekly for baseline and also at time of exacerbation and 30 minutes after treatment.  She should bring these recorded measurements in for   review at the time of prenatal visits.  Since she did not have a peak flow meter, I prescribed one for her prior to departure from my office today.   Impressions:  Kytzia D Bender is a 35 y.o. G5P4004 at [redacted]w[redacted]d - with well-controlled CHTN and baseline 24 hour urine demonstrating mild proteinuria.  Summary of Recommendations: I. HTN 1. Adjust Labetalol as clinically indicated over the course of this pregnancy; based upon my review of her records, I recommended to her today to increase her dose as follows:  Labetalol 200mg at 8am (from 100mg); 300mg at 2pm (same); and 300mg at 8pm (from 200mg).   I recommend a review again in 1 week, and if home values remain elevated to increase to 300mg po tid and to urge her to adjust her schedule to every 8 hours religiously,  2. Continue home BP monitoring 4 x daily with preeclamptic precautions given 3.  Mild proteinuria is a marker of underlying chronic, subclinical renal injury in association with chronic HTN; 4. Interval growth ultrasounds monthly; 5. Twice weekly NSTs and weekly AFIs beginning at [redacted] weeks gestation; 6. Delivery at [redacted] weeks gestation assuming HTN is medically controlled,  growth is AGA, AFI remains normal, testing is reassuring, and preeclampsia does not develop in the interim.  7. If any of the aforementioned exclusion criteria (recommendation #6) for expectant management are found prior to 39 weeks, I recommend induction/delivery as clinically warranted.  MFM would be happy to reconsult as felt necessary to help in the assessment and recommendations.  II. Asthma 1. Weekly peak flow meter measurement for baseline; 2. Check peak flow at time of needing albuterol and 30 minutes after treatment; 3.  Bring peak flow measurements and log of albuterol need to physician appointments 4. Prescription for peak flow meter was given.  Time Spent: I spent in excess of 60 minutes in consultation with this patient to review and evaluate her case, and provide her with an adequate discussion and education.  More than 50% of this time was spent in direct face-to-face counseling. It was a pleasure seeing your patient in the office today.  Thank you for consultation. Please do not hesitate to contact our service for any further questions.  I would consider it appropriate if desired for MFM to have a shared management of HTN during her monthly ultrasound visits but will leave this to your discretion.  J. M. Jamelah Sitzer, MD, MS, FACOG Assistant Professor, Maternal-Fetal Medicine   

## 2012-02-13 NOTE — Progress Notes (Signed)
Ms. Danielle [name] had an ultrasound appointment today.  Please see [facility]-OB/GY[facility] report for details.  Comments There is an active singleton fetus with no apparent dysmorphic features on today's routine anatomic re-examination.  The biometry suggests a fetus with an EFW at the approximately 52nd percentile for gestational age.    Impression Active singleton fetus Normal anatomic survey. Normal interval growth. Normal amniotic fluid volume CHTN  Recommendations 1. Repeat interval growth assessment by ultrasound was scheduled for your patient in 4 weeks. 2. Please see my full MFM consult note 3. Follow as clinically indicated.  Rogelia Boga, MD, MS, FACOG Assistant Professor Section of Maternal-Fetal Medicine [facility]

## 2012-02-14 LAB — CULTURE, OB URINE

## 2012-02-18 ENCOUNTER — Telehealth: Payer: Self-pay

## 2012-02-18 ENCOUNTER — Ambulatory Visit (INDEPENDENT_AMBULATORY_CARE_PROVIDER_SITE_OTHER): Payer: Medicaid Other | Admitting: Obstetrics and Gynecology

## 2012-02-18 VITALS — BP 110/64 | Wt 201.0 lb

## 2012-02-18 DIAGNOSIS — Z331 Pregnant state, incidental: Secondary | ICD-10-CM

## 2012-02-18 DIAGNOSIS — Z3490 Encounter for supervision of normal pregnancy, unspecified, unspecified trimester: Secondary | ICD-10-CM

## 2012-02-18 LAB — CBC
HCT: 29.8 % — ABNORMAL LOW (ref 36.0–46.0)
Hemoglobin: 9.9 g/dL — ABNORMAL LOW (ref 12.0–15.0)
MCH: 28.9 pg (ref 26.0–34.0)
MCHC: 33.2 g/dL (ref 30.0–36.0)
MCV: 86.9 fL (ref 78.0–100.0)
Platelets: 397 10*3/uL (ref 150–400)
RBC: 3.43 MIL/uL — ABNORMAL LOW (ref 3.87–5.11)
RDW: 13.6 % (ref 11.5–15.5)
WBC: 12.3 10*3/uL — ABNORMAL HIGH (ref 4.0–10.5)

## 2012-02-18 NOTE — Progress Notes (Signed)
[redacted]w[redacted]d CHTN: Labetalol 200 mg am, 300 mg at noon and 300 mg 8:00 pm. Changed by MFM. Recommends growth every 4 weeks and fetal testing 2x weekly at 32 weeks. Next ultrasound at 31+ weeks Asthma: worsening with pregnancy. Not responding to Pro-air.Lungs are clear. Will refer to pulmologist ASAP Glucola today

## 2012-02-18 NOTE — Telephone Encounter (Signed)
Per [place], GYN is requesting appt today.  RA w/ consult opening in [facility] office this afternoon.  Called spoke with Karren Burly and offered appt.  Karren Burly familiar w/ [facility] office location so stated she would be able to direct pt.  Karren Burly to call pt about appt and call back.  Appt slots "held" and message held in triage.  Karren Burly to ask for me by name.

## 2012-02-18 NOTE — Progress Notes (Signed)
Pt stated no issues today. Glucola given @  10:13 draw @ 11:13.

## 2012-02-18 NOTE — Telephone Encounter (Signed)
Pc to pt per referral to pulmonology for worsening asthma with preg per SR.  Appt sched 02/20/12 @ 11:00 with [place]. Pt unable to go to appt today offered @ 2:00p in [facility] office due to late response from pt after leaving vm rgdg the referral.  Pt voices understanding.

## 2012-02-18 NOTE — Telephone Encounter (Signed)
Called spoke with Karren Burly, pt obviously unable to make 2pm appt today w/ RA.  Karren Burly stated that she was told a Thursday appt at 11am would be held for pt - not done, no appts available for pt at that time.  Consult w/ MW made for 10.18.13 @ 1015.

## 2012-02-18 NOTE — Telephone Encounter (Signed)
Tc to pt. Per Cor[facility] Pulm prev appt for 02/20/12 offered in error, appt sched 02/21/12 10:15a.  Pt agrees.

## 2012-02-18 NOTE — Telephone Encounter (Signed)
Danielle [name] from Dr Antonietta Breach office returned call.  Call her back @ [phone] ext [phone].  Danielle [name]

## 2012-02-19 LAB — GLUCOSE TOLERANCE, 1 HOUR (50G) W/O FASTING: Glucose, 1 Hour GTT: 84 mg/dL (ref 70–140)

## 2012-02-19 LAB — RPR

## 2012-02-19 NOTE — Progress Notes (Signed)
Quick Note:  Please inform pt of worsening anemia. Should take PNV daily, iron-rich diet and also call in Nu-iron 150 mg BID to take with orange juice. ______

## 2012-02-21 ENCOUNTER — Telehealth: Payer: Self-pay | Admitting: Obstetrics and Gynecology

## 2012-02-21 ENCOUNTER — Ambulatory Visit (INDEPENDENT_AMBULATORY_CARE_PROVIDER_SITE_OTHER): Payer: Medicaid Other | Admitting: Internal Medicine

## 2012-02-21 ENCOUNTER — Telehealth: Payer: Self-pay

## 2012-02-21 ENCOUNTER — Encounter: Payer: Self-pay | Admitting: Internal Medicine

## 2012-02-21 VITALS — BP 120/88 | HR 72 | Temp 98.0°F | Ht 67.0 in | Wt 203.4 lb

## 2012-02-21 DIAGNOSIS — O99519 Diseases of the respiratory system complicating pregnancy, unspecified trimester: Secondary | ICD-10-CM

## 2012-02-21 DIAGNOSIS — O99891 Other specified diseases and conditions complicating pregnancy: Secondary | ICD-10-CM

## 2012-02-21 DIAGNOSIS — F17200 Nicotine dependence, unspecified, uncomplicated: Secondary | ICD-10-CM

## 2012-02-21 DIAGNOSIS — J45909 Unspecified asthma, uncomplicated: Secondary | ICD-10-CM

## 2012-02-21 MED ORDER — POLYSACCHARIDE IRON COMPLEX 150 MG PO CAPS: 150.0000 mg | ORAL_CAPSULE | Freq: Two times a day (BID) | ORAL | Status: DC

## 2012-02-21 MED ORDER — BUDESONIDE-FORMOTEROL FUMARATE 160-4.5 MCG/ACT IN AERO: INHALATION_SPRAY | RESPIRATORY_TRACT | Status: DC

## 2012-02-21 NOTE — Telephone Encounter (Signed)
LM for pt regarding msg from [facility].  Left all info on machine and  rx was called to pharmacy.  Pt to call if any questions and to sch appt Mon for BP ck.  ld

## 2012-02-21 NOTE — Patient Instructions (Signed)
Start symbicort 160 Take 2 puffs first thing in am and then another 2 puffs about 12 hours later.   Work on inhaler technique:  relax and gently blow all the way out then take a nice smooth deep breath back in, triggering the inhaler at same time you start breathing in.  Hold for up to 5 seconds if you can.  Rinse and gargle with water when done   If your mouth or throat starts to bother you,   I suggest you time the inhaler to your dental care and after using the inhaler(s) brush teeth and tongue with a baking soda containing toothpaste and when you rinse this out, gargle with it first to see if this helps your mouth and throat.    I will communicate with Dr Estanislado Pandy regarding your use of high dose labetalol which may be contributing to your poor asthma control  Please schedule a follow up office visit in 2 weeks, sooner if needed

## 2012-02-21 NOTE — Telephone Encounter (Signed)
LM for pt  That anemia is worsening. Will need to cont PNV and add Nu iron 150mg  BID.  Call if questions.  ld

## 2012-02-21 NOTE — Telephone Encounter (Signed)
Message copied by Larwance Rote on Fri Feb 21, 2012  3:57 PM ------      Message from: Hal Morales      Created: Fri Feb 21, 2012  3:51 PM      Regarding: med change       Please contact pt to:      1) d/c Normodyne (labetalol) after last dose today      2) start on 02/22/12:  Nifedipine 10 mg at 8 am, 4 pm and 10 pm      3) make an appt for 02/24/12 for BP check and medication increase.      She is not to stop Normodyne until she has picked up script for nifedipine 10 mg po bid# 120 with 2 refillsthanks

## 2012-02-21 NOTE — Telephone Encounter (Signed)
Telephone call to Dr. Vincenza Hews in MFM re options for managing pt's BP without labetalol.  She agrees that Aldomet, Apresoline or Nifedipine can be used.  She prefers to use nifedipine in short acting divided doses in pregnant patients rather than the extended release dose.  I confirmed with her that nifedipine 10 mg bid was a good starting dose, and that the dose most likely will need to be increased over time. I also called Dr. Sherene Sires with this information. Will d/c labetalol and start nifedipine 10 mg 8am, 4pm and 10 pm.  Pt to followup 02/24/12

## 2012-02-21 NOTE — Telephone Encounter (Signed)
Message copied by Larwance Rote on Fri Feb 21, 2012 10:43 AM ------      Message from: Stephens Shire      Created: Wed Feb 19, 2012  3:12 PM                   ----- Message -----         From: Esmeralda Arthur, MD         Sent: 02/19/2012  10:18 AM           To: Stephens Shire, CMA            Please inform pt of worsening anemia. Should take PNV daily, iron-rich diet and also call in Nu-iron 150 mg BID to take with orange juice.

## 2012-02-21 NOTE — Progress Notes (Signed)
  Subjective:    Patient ID: Danielle [name], female    DOB: Nov 05, 1975  MRN: [id number]  HPI  50 yobf smoker dx with asthma in 2000 which was 10 m p partum, then 2004 nl pregnancy delivery and no trouble at all or need for inhalers but did fine until 3 months pregnant with onset of sob restarted first the proaire then the nebulizer s improvement so referred 02/21/2012 to pulmonary clinic.   02/21/2012 1st pulmonary ov/ Wert @ 7 months gestation at 10-15 lbs heavier than previous pregancy 2004 with noct spb she related to nasal congestion,  Does ok in recline or sitting in chair, gets sob walking across parking lots no better with inhalers while on normodyne and feels sob has worsened since dose of normodyne increased. No leg swelling. No obvious daytime variabilty or assoc chronic cough or cp or chest tightness, subjective wheeze overt sinus or hb symptoms. No unusual exp hx    Also denies any obvious fluctuation of symptoms with weather or environmental changes or other aggravating or alleviating factors except as outlined above   Review of Systems  Constitutional: Negative for fever, chills and unexpected weight change.  HENT: Positive for congestion. Negative for ear pain, nosebleeds, sore throat, rhinorrhea, sneezing, trouble swallowing, dental problem, voice change, postnasal drip and sinus pressure.   Eyes: Negative for visual disturbance.  Respiratory: Positive for shortness of breath. Negative for cough and choking.   Cardiovascular: Negative for chest pain and leg swelling.  Gastrointestinal: Negative for vomiting, abdominal pain and diarrhea.  Genitourinary: Negative for difficulty urinating.  Musculoskeletal: Negative for arthralgias.  Skin: Negative for rash.  Neurological: Negative for tremors, syncope and headaches.  Hematological: Bruises/bleeds easily.       Objective:   Physical Exam  amb bf of approp gestational age in appearance  HEENT: nl dentition, turbinates,  and orophanx. Nl external ear canals without cough reflex   NECK :  without JVD/Nodes/TM/ nl carotid upstrokes bilaterally   LUNGS: no acc muscle use, trace end exp wheeze bilaterally without cough on insp or exp maneuvers   CV:  RRR  no s3 or murmur or increase in P2, no edema   ABD:  soft and c/w stated gestational age and nl excursion in the supine position. No bruits or organomegaly, bowel sounds nl  MS:  warm without deformities, calf tenderness, cyanosis or clubbing  SKIN: warm and dry without lesions    NEURO:  alert, approp, no deficits        Assessment & Plan:

## 2012-02-22 DIAGNOSIS — F17200 Nicotine dependence, unspecified, uncomplicated: Secondary | ICD-10-CM | POA: Insufficient documentation

## 2012-02-22 DIAGNOSIS — J45909 Unspecified asthma, uncomplicated: Secondary | ICD-10-CM | POA: Insufficient documentation

## 2012-02-22 NOTE — Assessment & Plan Note (Signed)
>   3 min Discussed role of smoking in complications not only of airways dz but also in terms of 02 delivery to her baby but did not appear to be willing to commit to quit at this point

## 2012-02-22 NOTE — Assessment & Plan Note (Addendum)
DDX of  difficult airways managment all start with A and  include Adherence, Ace Inhibitors, Acid Reflux, Active Sinus Disease, Alpha 1 Antitripsin deficiency, Anxiety masquerading as Airways dz,  ABPA,  allergy(esp in young), Aspiration (esp in elderly), Adverse effects of DPI,  Active smokers, plus two Bs  = Bronchiectasis and Beta blocker use..and one C= CHF   Adherence is always the initial "prime suspect" and is a multilayered concern that requires a "trust but verify" approach in every patient - starting with knowing how to use medications, especially inhalers, correctly, keeping up with refills and understanding the fundamental difference between maintenance and prns vs those medications only taken for a very short course and then stopped and not refilled. The proper method of use, as well as anticipated side effects, of a metered-dose inhaler are discussed and demonstrated to the patient. Improved effectiveness after extensive coaching during this visit to a level of approximately  75% so try symbicort 160 Take 2 puffs first thing in am and then another 2 puffs about 12 hours later.    Active smoking, discussed separately.  ? Acid reflux issues, always a concern with IUP  ? Active sinus dz > rx per gyn  ? Beta blocker effect > discussed with dr Pennie Rushing who will try to transition to ccb

## 2012-02-24 ENCOUNTER — Encounter: Payer: Self-pay | Admitting: Obstetrics and Gynecology

## 2012-02-24 ENCOUNTER — Telehealth: Payer: Self-pay | Admitting: Obstetrics and Gynecology

## 2012-02-24 ENCOUNTER — Telehealth: Payer: Self-pay

## 2012-02-24 ENCOUNTER — Ambulatory Visit (INDEPENDENT_AMBULATORY_CARE_PROVIDER_SITE_OTHER): Payer: Medicaid Other | Admitting: Obstetrics and Gynecology

## 2012-02-24 ENCOUNTER — Ambulatory Visit: Payer: Medicaid Other | Admitting: Obstetrics and Gynecology

## 2012-02-24 VITALS — BP 120/80 | Wt 194.5 lb

## 2012-02-24 DIAGNOSIS — O09299 Supervision of pregnancy with other poor reproductive or obstetric history, unspecified trimester: Secondary | ICD-10-CM

## 2012-02-24 DIAGNOSIS — O10019 Pre-existing essential hypertension complicating pregnancy, unspecified trimester: Secondary | ICD-10-CM

## 2012-02-24 DIAGNOSIS — L0292 Furuncle, unspecified: Secondary | ICD-10-CM

## 2012-02-24 DIAGNOSIS — O09529 Supervision of elderly multigravida, unspecified trimester: Secondary | ICD-10-CM

## 2012-02-24 DIAGNOSIS — Z331 Pregnant state, incidental: Secondary | ICD-10-CM

## 2012-02-24 DIAGNOSIS — R809 Proteinuria, unspecified: Secondary | ICD-10-CM

## 2012-02-24 NOTE — Telephone Encounter (Signed)
Attempt to call pt regarding msg left on Friday 02-21-12.  Left second msg for pt to make sure she had r/c instructions on medication and recheck on BP.  Pt to call if questions.  ld

## 2012-02-24 NOTE — Telephone Encounter (Signed)
Several attempts made by Vernona Rieger and myself to contact pt. Left vm on pt cell phone number to come in at 2:00 today and to contact office asap.

## 2012-02-24 NOTE — Telephone Encounter (Signed)
Attempted phone call to another phone number listed.  [phone] does not accept incoming phone calls.  ld

## 2012-02-24 NOTE — Telephone Encounter (Signed)
Message copied by Larwance Rote on Mon Feb 24, 2012  8:58 AM ------      Message from: Hal Morales      Created: Fri Feb 21, 2012  3:51 PM      Regarding: med change       Please contact pt to:      1) d/c Normodyne (labetalol) after last dose today      2) start on 02/22/12:  Nifedipine 10 mg at 8 am, 4 pm and 10 pm      3) make an appt for 02/24/12 for BP check and medication increase.      She is not to stop Normodyne until she has picked up script for nifedipine 10 mg po bid# 120 with 2 refillsthanks

## 2012-02-24 NOTE — Progress Notes (Signed)
[redacted]w[redacted]d Patient unable to void- tried on 2 different occasion with water offered and was able to void while at office visit. B/p check - normal 120/80 on Procardia 10mg  TID only as Pulmonologist has advised to stop taking Labetelol FH = to dates FM + C/o acid reflux and advised to start on Zantiac 150mg  po BID (OTC) ROB x 1 week

## 2012-02-25 ENCOUNTER — Other Ambulatory Visit: Payer: Self-pay | Admitting: Obstetrics and Gynecology

## 2012-02-25 DIAGNOSIS — O1210 Gestational proteinuria, unspecified trimester: Secondary | ICD-10-CM

## 2012-02-26 ENCOUNTER — Telehealth: Payer: Self-pay | Admitting: Obstetrics and Gynecology

## 2012-02-26 NOTE — Telephone Encounter (Signed)
Message copied by Delon Sacramento on Wed Feb 26, 2012  9:11 AM ------      Message from: Cornelius Moras      Created: Tue Feb 25, 2012  1:12 PM      Regarding: Need 24 hour urine       Please call patient to redo and bring 24 hour urine to next visit, due to chronic proteinuria.            Her visit is on 03/04/12.            Thanks!      VL

## 2012-02-26 NOTE — Telephone Encounter (Signed)
OK VL

## 2012-02-26 NOTE — Telephone Encounter (Signed)
Tc to pt regarding msg below.  Lm on vm to call back. 

## 2012-02-26 NOTE — Telephone Encounter (Signed)
Message copied by Delon Sacramento on Wed Feb 26, 2012 11:10 AM ------      Message from: Cornelius Moras      Created: Tue Feb 25, 2012  1:12 PM      Regarding: Need 24 hour urine       Please call patient to redo and bring 24 hour urine to next visit, due to chronic proteinuria.            Her visit is on 03/04/12.            Thanks!      VL

## 2012-02-26 NOTE — Telephone Encounter (Signed)
Tc to pt regarding msg below, pt voices understanding but states she will be unable to do so next week because she starts work next week, but can complete and turn in this week as she is not working this week.  Pt says she can pick up materials btn today and tomorrow.  Informed will make VL aware of when 24 hour urine can be done.

## 2012-02-27 ENCOUNTER — Institutional Professional Consult (permissible substitution): Payer: Medicaid Other | Admitting: Internal Medicine

## 2012-02-28 ENCOUNTER — Telehealth: Payer: Self-pay

## 2012-02-28 NOTE — Telephone Encounter (Signed)
LM for pt to cb re: + UTI and the need for treatment. Pt has appt for Wed the 30th w/ SR for ob f/u. Melody Comas A

## 2012-03-02 ENCOUNTER — Other Ambulatory Visit: Payer: Self-pay

## 2012-03-02 ENCOUNTER — Telehealth: Payer: Self-pay

## 2012-03-02 NOTE — Telephone Encounter (Signed)
Spoke to pt and was able to verify that her recent UTI was treated. I advised her to collect a clean catch sample at her visit on Wed. With Dr. Estanislado Pandy so that we can sent her urine for TOC.  She is agreeable and will do so. Danielle [name] A

## 2012-03-02 NOTE — Telephone Encounter (Signed)
Attempted to reach pt again, regarding UCX.  I need to verify if she ever rec'd treatment for UTI. I have not been able to reach her to Rx treatment or find out whether anyone has treated this infection, as she has had several recent visits since culture was done.  I LM for pt to cb to discuss. Repeat UCX order is in the computer and was ordered by VL. Melody Comas A

## 2012-03-04 ENCOUNTER — Ambulatory Visit (INDEPENDENT_AMBULATORY_CARE_PROVIDER_SITE_OTHER): Payer: Medicaid Other | Admitting: Obstetrics and Gynecology

## 2012-03-04 ENCOUNTER — Encounter: Payer: Self-pay | Admitting: Obstetrics and Gynecology

## 2012-03-04 VITALS — BP 100/72 | Wt 198.0 lb

## 2012-03-04 DIAGNOSIS — J111 Influenza due to unidentified influenza virus with other respiratory manifestations: Secondary | ICD-10-CM

## 2012-03-04 DIAGNOSIS — O10919 Unspecified pre-existing hypertension complicating pregnancy, unspecified trimester: Secondary | ICD-10-CM

## 2012-03-04 DIAGNOSIS — R309 Painful micturition, unspecified: Secondary | ICD-10-CM

## 2012-03-04 DIAGNOSIS — N23 Unspecified renal colic: Secondary | ICD-10-CM

## 2012-03-04 DIAGNOSIS — O10019 Pre-existing essential hypertension complicating pregnancy, unspecified trimester: Secondary | ICD-10-CM

## 2012-03-04 LAB — POCT URINALYSIS DIPSTICK
Nitrite, UA: NEGATIVE
pH, UA: 6

## 2012-03-04 NOTE — Progress Notes (Signed)
[redacted]w[redacted]d CHTN Procardia 10 md TID D/C Labetalol due to worsening asthma Flu vaccine discussed and still delined Ultrasouind and BPP at next visit

## 2012-03-04 NOTE — Progress Notes (Signed)
[redacted]w[redacted]d  Pt still having urinary frequency Pt decline flu vaccine. 02/07/2012 1 hr GTT = 84

## 2012-03-06 ENCOUNTER — Encounter: Payer: Medicaid Other | Admitting: Internal Medicine

## 2012-03-06 NOTE — Progress Notes (Signed)
 This encounter was created in error - please disregard.

## 2012-03-12 ENCOUNTER — Ambulatory Visit (HOSPITAL_COMMUNITY): Admission: RE | Admit: 2012-03-12 | Payer: Medicaid Other | Source: Ambulatory Visit

## 2012-03-18 ENCOUNTER — Ambulatory Visit (INDEPENDENT_AMBULATORY_CARE_PROVIDER_SITE_OTHER): Payer: Medicaid Other

## 2012-03-18 ENCOUNTER — Encounter: Payer: Self-pay | Admitting: Obstetrics and Gynecology

## 2012-03-18 ENCOUNTER — Ambulatory Visit (INDEPENDENT_AMBULATORY_CARE_PROVIDER_SITE_OTHER): Payer: Medicaid Other | Admitting: Obstetrics and Gynecology

## 2012-03-18 ENCOUNTER — Other Ambulatory Visit: Payer: Self-pay | Admitting: Obstetrics and Gynecology

## 2012-03-18 VITALS — BP 112/70 | Wt 199.0 lb

## 2012-03-18 DIAGNOSIS — O10019 Pre-existing essential hypertension complicating pregnancy, unspecified trimester: Secondary | ICD-10-CM

## 2012-03-18 DIAGNOSIS — O09529 Supervision of elderly multigravida, unspecified trimester: Secondary | ICD-10-CM

## 2012-03-18 DIAGNOSIS — O09299 Supervision of pregnancy with other poor reproductive or obstetric history, unspecified trimester: Secondary | ICD-10-CM

## 2012-03-18 DIAGNOSIS — O26879 Cervical shortening, unspecified trimester: Secondary | ICD-10-CM

## 2012-03-18 DIAGNOSIS — O10919 Unspecified pre-existing hypertension complicating pregnancy, unspecified trimester: Secondary | ICD-10-CM

## 2012-03-18 LAB — US OB TRANSVAGINAL

## 2012-03-18 NOTE — Progress Notes (Signed)
[redacted]w[redacted]d  Pt declined flu vaccine

## 2012-03-18 NOTE — Progress Notes (Signed)
Ultrasound shows:  SIUP  S=D     [place] EDD: 05/15/2012           EFW: 4lbs 3 oz           AFI: 21.0           Cervical length: 1.05 cm           Placenta localization: posterior           Fetal presentation: vertex Comments: fluid is upper normal, AFI of 21 cm = 80th%. EFW = 63rd%. cx closed. Short. Measured transvaginally. Length = 1.0 cm. Membrane seen internal os.

## 2012-03-18 NOTE — Progress Notes (Signed)
[redacted]w[redacted]d  Contractions < 6 per hour  Heavier discharge CHTN No sx of pre-eclampsia Repeat 24 hour urine  Start fetal testing 2 x weekly

## 2012-03-23 ENCOUNTER — Other Ambulatory Visit: Payer: Self-pay | Admitting: Obstetrics and Gynecology

## 2012-03-23 ENCOUNTER — Ambulatory Visit (INDEPENDENT_AMBULATORY_CARE_PROVIDER_SITE_OTHER): Payer: Medicaid Other | Admitting: Obstetrics and Gynecology

## 2012-03-23 ENCOUNTER — Ambulatory Visit: Payer: Medicaid Other

## 2012-03-23 ENCOUNTER — Inpatient Hospital Stay (HOSPITAL_COMMUNITY)
Admission: AD | Admit: 2012-03-23 | Discharge: 2012-03-23 | Disposition: A | Discharge status: Home / Self Care | Payer: Medicaid Other | Source: Ambulatory Visit | Attending: Obstetrics and Gynecology | Admitting: Obstetrics and Gynecology

## 2012-03-23 ENCOUNTER — Encounter (HOSPITAL_COMMUNITY): Payer: Self-pay

## 2012-03-23 VITALS — BP 126/74 | Wt 200.0 lb

## 2012-03-23 VITALS — BP 126/74

## 2012-03-23 DIAGNOSIS — IMO0001 Reserved for inherently not codable concepts without codable children: Secondary | ICD-10-CM | POA: Insufficient documentation

## 2012-03-23 DIAGNOSIS — O09299 Supervision of pregnancy with other poor reproductive or obstetric history, unspecified trimester: Secondary | ICD-10-CM

## 2012-03-23 DIAGNOSIS — R809 Proteinuria, unspecified: Secondary | ICD-10-CM

## 2012-03-23 DIAGNOSIS — O4700 False labor before 37 completed weeks of gestation, unspecified trimester: Secondary | ICD-10-CM

## 2012-03-23 DIAGNOSIS — O169 Unspecified maternal hypertension, unspecified trimester: Secondary | ICD-10-CM

## 2012-03-23 DIAGNOSIS — O10919 Unspecified pre-existing hypertension complicating pregnancy, unspecified trimester: Secondary | ICD-10-CM

## 2012-03-23 DIAGNOSIS — O10019 Pre-existing essential hypertension complicating pregnancy, unspecified trimester: Secondary | ICD-10-CM | POA: Insufficient documentation

## 2012-03-23 DIAGNOSIS — L0293 Carbuncle, unspecified: Secondary | ICD-10-CM

## 2012-03-23 DIAGNOSIS — L0292 Furuncle, unspecified: Secondary | ICD-10-CM

## 2012-03-23 DIAGNOSIS — O09529 Supervision of elderly multigravida, unspecified trimester: Secondary | ICD-10-CM

## 2012-03-23 LAB — CBC
HCT: 33.1 % — ABNORMAL LOW (ref 36.0–46.0)
MCHC: 33.2 g/dL (ref 30.0–36.0)
Platelets: 392 10*3/uL (ref 150–400)
RDW: 13.3 % (ref 11.5–15.5)

## 2012-03-23 LAB — FETAL FIBRONECTIN: Fetal Fibronectin: POSITIVE — AB

## 2012-03-23 LAB — OB RESULTS CONSOLE GBS: GBS: NEGATIVE

## 2012-03-23 LAB — COMPREHENSIVE METABOLIC PANEL
ALT: 10 U/L (ref 0–35)
BUN: 6 mg/dL (ref 6–23)
CO2: 25 mEq/L (ref 19–32)
Calcium: 9.4 mg/dL (ref 8.4–10.5)
Creatinine, Ser: 0.54 mg/dL (ref 0.50–1.10)
GFR calc Af Amer: >90 mL/min (ref >90)
GFR calc non Af Amer: >90 mL/min (ref >90)
Glucose, Bld: 96 mg/dL (ref 70–99)
Sodium: 134 mEq/L — ABNORMAL LOW (ref 135–145)

## 2012-03-23 LAB — URIC ACID: Uric Acid, Serum: 4.1 mg/dL (ref 2.4–7.0)

## 2012-03-23 MED ORDER — NIFEDIPINE 10 MG PO CAPS
10.0000 mg | ORAL_CAPSULE | Freq: Three times a day (TID) | ORAL | Status: DC
  Administered 2012-03-23: 10 mg via ORAL
  Filled 2012-03-23: qty 1

## 2012-03-23 MED ORDER — BETAMETHASONE SOD PHOS & ACET 6 (3-3) MG/ML IJ SUSP
12.0000 mg | INTRAMUSCULAR | Status: DC
  Administered 2012-03-23: 12 mg via INTRAMUSCULAR
  Filled 2012-03-23: qty 2

## 2012-03-23 NOTE — MAU Note (Signed)
Patient states that she was sent from the office for monitoring because she is 3cm. She states that the contractions  Are irregular.

## 2012-03-23 NOTE — Progress Notes (Signed)
[redacted]w[redacted]d. NST today. Pt needs to be seen, per [facility]. Alvino Chapel

## 2012-03-23 NOTE — MAU Note (Signed)
Patient states she was sent from the office to be monitored for contractions. Patient states she has been having "Braxton Hicks" contractions and some back pain. Reports good fetal movement and no bleeding or leaking.

## 2012-03-23 NOTE — MAU Provider Note (Signed)
History   36 yo G5P4004 at 65 3/7 weeks presented from office for monitoring and Betamethasone due to cervix 3 cm, 50%, vtx by Dr. Debria Garret exam today around 2:30pm.  Had NST due to chronic hypertension, with contractions noted.  FFN and cultures done at office, results pending.  Reports +FM, denies leaking or bleeding.  Reports has noted more contractions over the weekend.  On Procardia 10 mg po TID for hypertension--stopped Labetalol due to asthma sx.  Next dose due now.  Patient Active Problem List  Diagnosis  . Chronic hypertension in pregnancy  . Vaginal bleeding in pregnancy, first trimester  . Proteinuria  . Hx of preeclampsia, prior pregnancy, currently pregnant  . Recurrent boils  . AMA (advanced maternal age) multigravida 35+  . Suppurative hidradenitis  . Abnormal umbilical cord  . Asthma complicating pregnancy, antepartum  . Smoker  . Flu  Hypocoiled cord   Chief Complaint  Patient presents with  . Labor Eval     OB History    Grav Para Term Preterm Abortions TAB SAB Ect Mult Living   5 4 4  0      4      Past Medical History  Diagnosis Date  . Trichimoniasis   . Yeast infection   . Preterm labor 1995  . Anemia     WITH PREGNANCY  . Asthma 08/2011    ALBUTEROL  INHALER PRN  . Infection     UTI X 1  . Headache     MIGRAINES RESOLVED  . Arm fracture AGE 66  . Hypertension 2001  . Pregnancy induced hypertension     Past Surgical History  Procedure Date  . Wisdom tooth extraction     Family History  Problem Relation Age of Onset  . Diabetes Mother   . Hypertension Mother   . Drug abuse Mother   . Hypertension Sister   . Heart disease Maternal Grandfather   . Diabetes Maternal Grandfather   . Hypertension Maternal Grandfather   . Diabetes Paternal Aunt   . Cancer Paternal Grandmother   . Stroke Paternal Grandfather   . Hypertension Paternal Grandfather   . Cancer Paternal Grandfather     History  Substance Use Topics  . Smoking status:  Current Some Day Smoker -- 0.2 packs/day for 20 years    Types: Cigarettes  . Smokeless tobacco: Never Used  . Alcohol Use: No     Comment: occ prior to pregnancy    Allergies:  Allergies  Allergen Reactions  . Penicillins Swelling    SEVERE SWELLING OF LIPS. HAS TAKEN KEFLEX WITHOUT DIFFICULTY    Prescriptions prior to admission  Medication Sig Dispense Refill  . Acetaminophen (TYLENOL PO) Take by mouth as needed.      Marland Kitchen albuterol (PROVENTIL HFA;VENTOLIN HFA) 108 (90 BASE) MCG/ACT inhaler Inhale 2 puffs into the lungs every 6 (six) hours as needed. Shortness of breath  1 Inhaler  4  . albuterol (PROVENTIL) (2.5 MG/3ML) 0.083% nebulizer solution Take 2.5 mg by nebulization every 6 (six) hours as needed.      . budesonide-formoterol (SYMBICORT) 160-4.5 MCG/ACT inhaler Take 2 puffs first thing in am and then another 2 puffs about 12 hours later.  1 Inhaler    . labetalol (NORMODYNE) 100 MG tablet 2 tablet in the morning, 3 tablets at mid-day, and 3 tablets at night. (8 am, 2pm, 8pm)      . NIFEdipine (PROCARDIA) 10 MG capsule Take 10 mg by mouth 3 (three) times daily.      Marland Kitchen  promethazine (PHENERGAN) 25 MG tablet Take 1 tablet (25 mg total) by mouth every 6 (six) hours as needed for nausea.  30 tablet  1  . Simethicone (GAS-X PO) As directed as needed         Physical Exam   Blood pressure 134/94, pulse 106, temperature 98.4 F (36.9 C), temperature source Oral, resp. rate 18, height 5' 5.5" (1.664 m), weight 200 lb 3.2 oz (90.81 kg), last menstrual period 07/28/2011.  Chest clear Heart RRR without murmur Abd gravid, NT Pelvic--recheck of cervix external os 3cm, internal os 1 cm, 50%, vtx ballotable. Ext WNL  FHR reactive, no decels Some irritability, occasional more defined UCs.    ED Course  IUP at 32 3/7 weeks PT cervical change Chronic hypertension--on Procardia 10 mg po TID.   Hypocoiled cord AMA Asthma Chronic proteinuria  Plan: Betamethasone 12 mg IM  now--repeat in 24 hours Observe contraction pattern at present. Procardia 10 mg po now (due as routine dose for HTN). Will consult regarding further plan of care.   Nigel Bridgeman CNM, [place] 03/23/2012 4:23 PM   Addendum: Received 1st dose betamethasone. Very sporadic contractions during MAU time. Filed Vitals:   03/23/12 1602 03/23/12 1609 03/23/12 1615 03/23/12 1842  BP:  127/94 134/94 142/97  Pulse:  88 106 80  Temp:   98.4 F (36.9 C)   TempSrc:   Oral   Resp:  18 18 20   Height: 5' 5.5" (1.664 m)     Weight: 200 lb 3.2 oz (90.81 kg)      Results for orders placed during the hospital encounter of 03/23/12 (from the past 24 hour(s))  CBC     Status: Abnormal   Collection Time   03/23/12  6:35 PM      Component Value Range   WBC 16.2 (*) 4.0 - 10.5 K/uL   RBC 3.79 (*) 3.87 - 5.11 MIL/uL   Hemoglobin 11.0 (*) 12.0 - 15.0 g/dL   HCT 16.1 (*) 09.6 - 04.5 %   MCV 87.3  78.0 - 100.0 fL   MCH 29.0  26.0 - 34.0 pg   MCHC 33.2  30.0 - 36.0 g/dL   RDW 40.9  81.1 - 91.4 %   Platelets 392  150 - 400 K/uL  COMPREHENSIVE METABOLIC PANEL     Status: Abnormal   Collection Time   03/23/12  6:35 PM      Component Value Range   Sodium 134 (*) 135 - 145 mEq/L   Potassium 3.4 (*) 3.5 - 5.1 mEq/L   Chloride 98  96 - 112 mEq/L   CO2 25  19 - 32 mEq/L   Glucose, Bld 96  70 - 99 mg/dL   BUN 6  6 - 23 mg/dL   Creatinine, Ser 7.82  0.50 - 1.10 mg/dL   Calcium 9.4  8.4 - 95.6 mg/dL   Total Protein 7.1  6.0 - 8.3 g/dL   Albumin 2.7 (*) 3.5 - 5.2 g/dL   AST 13  0 - 37 U/L   ALT 10  0 - 35 U/L   Alkaline Phosphatase 131 (*) 39 - 117 U/L   Total Bilirubin 0.2 (*) 0.3 - 1.2 mg/dL   GFR calc non Af Amer >90  >90 mL/min   GFR calc Af Amer >90  >90 mL/min  URIC ACID     Status: Normal   Collection Time   03/23/12  6:35 PM      Component Value Range   Uric Acid,  Serum 4.1  2.4 - 7.0 mg/dL  LACTATE DEHYDROGENASE     Status: Normal   Collection Time   03/23/12  6:35 PM      Component Value  Range   LDH 112  94 - 250 U/L   PIH labs done per request Dr. Reece Leader hour urine still pending.  FFN +.  Consulted with Dr. Estanislado Pandy. Patient home on BR, and out of work. S/S PTL reviewed. Continue Procardia 10 mg po TID for HTN. Return tomorrow to MAU for 2nd dose betamethasone. Keep appt for NST on Thursday at [facility], and call prn with > 5-6 UCs/hr.  Nigel Bridgeman, CNM 03/23/12 7p

## 2012-03-23 NOTE — Progress Notes (Addendum)
From [id number], patient efm was archived on another patient by error (crystal jones). Baseline 135, reactive, moderate variability, 3 contractions noted which lasted between 60 seconds and 80 seconds.

## 2012-03-23 NOTE — Progress Notes (Signed)
[redacted]w[redacted]d Patient with chronic hypertension. Currently taking Procardia. Reactive nonstress test today.  Contractions noted. Preterm and premature cervical dilatation. GC, Chlamydia, beta strep, and urine culture sent from the office. Fetal fibronectin obtained prior to examination. 24-hour urine for protein sent from the office. To [facility] for betamethasone. Dr. Stefano Gaul

## 2012-03-24 ENCOUNTER — Encounter: Payer: Self-pay | Admitting: Obstetrics and Gynecology

## 2012-03-24 ENCOUNTER — Inpatient Hospital Stay (HOSPITAL_COMMUNITY)
Admission: AD | Admit: 2012-03-24 | Discharge: 2012-03-24 | Disposition: A | Discharge status: Home / Self Care | Payer: Medicaid Other | Source: Ambulatory Visit | Attending: Obstetrics and Gynecology | Admitting: Obstetrics and Gynecology

## 2012-03-24 ENCOUNTER — Telehealth: Payer: Self-pay | Admitting: Obstetrics and Gynecology

## 2012-03-24 DIAGNOSIS — O4700 False labor before 37 completed weeks of gestation, unspecified trimester: Secondary | ICD-10-CM | POA: Insufficient documentation

## 2012-03-24 LAB — GC/CHLAMYDIA PROBE AMP
CT Probe RNA: NEGATIVE
GC Probe RNA: NEGATIVE

## 2012-03-24 MED ORDER — BETAMETHASONE SOD PHOS & ACET 6 (3-3) MG/ML IJ SUSP
12.5000 mg | Freq: Once | INTRAMUSCULAR | Status: AC
  Administered 2012-03-24: 12.5 mg via INTRAMUSCULAR
  Filled 2012-03-24: qty 2.1

## 2012-03-24 NOTE — Telephone Encounter (Signed)
Pt is requesting a letter so she can give to her employer rgd bedrest for the rest of her pregnancy. Thank you [name]

## 2012-03-24 NOTE — Telephone Encounter (Signed)
Have created a letter for patient's bedrest issue--you should be able to print it out and fax or give to patient as needed.  VL

## 2012-03-24 NOTE — Telephone Encounter (Signed)
Spoke with pt rgd msg. Per VL sent me a letter rgd bedrest per pt's request. Advised pt that her letter was ready for her . i told pt i could fax it to her employer or she stated her husband could pick it up. Pt's voice understanding.

## 2012-03-25 ENCOUNTER — Encounter (HOSPITAL_COMMUNITY): Payer: Self-pay

## 2012-03-25 ENCOUNTER — Telehealth: Payer: Self-pay | Admitting: Obstetrics and Gynecology

## 2012-03-25 ENCOUNTER — Inpatient Hospital Stay (HOSPITAL_COMMUNITY)
Admission: AD | Admit: 2012-03-25 | Discharge: 2012-03-25 | Disposition: A | Discharge status: Home / Self Care | Payer: Medicaid Other | Source: Ambulatory Visit | Attending: Obstetrics and Gynecology | Admitting: Obstetrics and Gynecology

## 2012-03-25 DIAGNOSIS — O09299 Supervision of pregnancy with other poor reproductive or obstetric history, unspecified trimester: Secondary | ICD-10-CM

## 2012-03-25 DIAGNOSIS — O09529 Supervision of elderly multigravida, unspecified trimester: Secondary | ICD-10-CM

## 2012-03-25 DIAGNOSIS — O10919 Unspecified pre-existing hypertension complicating pregnancy, unspecified trimester: Secondary | ICD-10-CM

## 2012-03-25 DIAGNOSIS — L0293 Carbuncle, unspecified: Secondary | ICD-10-CM

## 2012-03-25 DIAGNOSIS — O10019 Pre-existing essential hypertension complicating pregnancy, unspecified trimester: Secondary | ICD-10-CM | POA: Insufficient documentation

## 2012-03-25 DIAGNOSIS — O4700 False labor before 37 completed weeks of gestation, unspecified trimester: Secondary | ICD-10-CM

## 2012-03-25 DIAGNOSIS — R809 Proteinuria, unspecified: Secondary | ICD-10-CM

## 2012-03-25 LAB — WET PREP, GENITAL

## 2012-03-25 LAB — URINALYSIS, ROUTINE W REFLEX MICROSCOPIC
Glucose, UA: NEGATIVE mg/dL
Ketones, ur: 15 mg/dL — AB
Leukocytes, UA: NEGATIVE
Protein, ur: 100 mg/dL — AB
Urobilinogen, UA: 0.2 mg/dL (ref 0.0–1.0)

## 2012-03-25 LAB — URINE MICROSCOPIC-ADD ON

## 2012-03-25 NOTE — Telephone Encounter (Signed)
Please see pt msg 

## 2012-03-25 NOTE — MAU Provider Note (Signed)
History     CSN: [id number]  Arrival date and time: 03/25/12 0936   None     Chief Complaint  Patient presents with  . Labor Eval   HPI Comments: Pt is a 36yo G5P4 at [redacted]w[redacted]d that arrived unannounced w c/o more frequent and painful ctx. States they started about 4am and having "at least" 3/hour. C/o menstrual like cramping and increased mucous/dc. Denies LOF or VB, +FM. States she's had 1 bottle of water this AM.  Pt had a +FFN and was given BMZ 11/18, 11/19, cervix was 3cm. Has been on BR at home.  She is taking procardia 10mg  TID for HTN, last took at 8am.      Past Medical History  Diagnosis Date  . Trichimoniasis   . Yeast infection   . Preterm labor 1995  . Anemia     WITH PREGNANCY  . Asthma 08/2011    ALBUTEROL  INHALER PRN  . Infection     UTI X 1  . Headache     MIGRAINES RESOLVED  . Arm fracture AGE 23  . Hypertension 2001  . Pregnancy induced hypertension     Past Surgical History  Procedure Date  . Wisdom tooth extraction     Family History  Problem Relation Age of Onset  . Diabetes Mother   . Hypertension Mother   . Drug abuse Mother   . Hypertension Sister   . Heart disease Maternal Grandfather   . Diabetes Maternal Grandfather   . Hypertension Maternal Grandfather   . Diabetes Paternal Aunt   . Cancer Paternal Grandmother   . Stroke Paternal Grandfather   . Hypertension Paternal Grandfather   . Cancer Paternal Grandfather     History  Substance Use Topics  . Smoking status: Current Some Day Smoker -- 0.2 packs/day for 20 years    Types: Cigarettes  . Smokeless tobacco: Never Used  . Alcohol Use: No     Comment: occ prior to pregnancy    Allergies:  Allergies  Allergen Reactions  . Penicillins Swelling    SEVERE SWELLING OF LIPS. HAS TAKEN KEFLEX WITHOUT DIFFICULTY    Prescriptions prior to admission  Medication Sig Dispense Refill  . acetaminophen (TYLENOL) 500 MG tablet Take 1,000 mg by mouth daily as needed. For pain      .  budesonide-formoterol (SYMBICORT) 160-4.5 MCG/ACT inhaler Take 2 puffs first thing in am and then another 2 puffs about 12 hours later.  1 Inhaler    . calcium carbonate (TUMS - DOSED IN MG ELEMENTAL CALCIUM) 500 MG chewable tablet Chew 1 tablet by mouth daily. For heartburn.      Marland Kitchen NIFEdipine (PROCARDIA) 10 MG capsule Take 10 mg by mouth 3 (three) times daily.      . Simethicone (GAS-X PO) As directed as needed      . albuterol (PROVENTIL HFA;VENTOLIN HFA) 108 (90 BASE) MCG/ACT inhaler Inhale 2 puffs into the lungs every 6 (six) hours as needed. Shortness of breath  1 Inhaler  4  . albuterol (PROVENTIL) (2.5 MG/3ML) 0.083% nebulizer solution Take 2.5 mg by nebulization every 6 (six) hours as needed.      . promethazine (PHENERGAN) 25 MG tablet Take 1 tablet (25 mg total) by mouth every 6 (six) hours as needed for nausea.  30 tablet  1    Review of Systems  Genitourinary:       Cramping and pelvic pressure, vaginal dc  Psychiatric/Behavioral: The patient is nervous/anxious.   All other systems  reviewed and are negative.   Physical Exam   Blood pressure 127/92, pulse 94, temperature 97 F (36.1 C), temperature source Oral, resp. rate 18, last menstrual period 07/28/2011.  Physical Exam  Nursing note and vitals reviewed. Constitutional: She is oriented to person, place, and time. She appears well-developed and well-nourished. She appears distressed.       Pt visibly anxious   HENT:  Head: Normocephalic.  Eyes: Pupils are equal, round, and reactive to light.  Neck: Normal range of motion.  Cardiovascular: Normal rate, regular rhythm and normal heart sounds.   Respiratory: Effort normal and breath sounds normal.  GI: Soft. Bowel sounds are normal.       Ctx palpate mild   Genitourinary: Vagina normal.       Cervix w small irritation noted at 7 o'clock  sm amt normal appearing white d/c  cx 3/th/high, pp ballottable   Musculoskeletal: Normal range of motion.  Neurological: She is  alert and oriented to person, place, and time. She has normal reflexes.  Skin: Skin is warm and dry.  Psychiatric: She has a normal mood and affect. Her behavior is normal.   FHR cat 1 toco irregular  MAU Course  Procedures    Assessment and Plan  IUP at [redacted]w[redacted]d  Preterm dilation/ctx +FFN, rcv'd BMZ, no cervical change  CHTN - stable  Wet prep - pending Will c/w Dr Pennie Rushing re: further mgmnt     Andalyn Heckstall M 03/25/2012, 10:55 AM   Addendum:   S: Pt feels reassured and less anxious about being 3cm dilated   O: VSS UA - concentrated w ketones Wet prep neg FHR cat 1 toco - rare  A: preterm dilation without evidence of PTL  CHTN - without evidence of pre-eclampsia   P:  D/w Dr Pennie Rushing Will d/c home to continue bedrest Continue procardia 10mg  TID rv'd hydration and PO intake rv'd PTL and [facility] Has f/u appt tmrw and NST

## 2012-03-25 NOTE — MAU Note (Signed)
Pt states yesterday was 3cm in office. Pt thinks having ctx's 3/hour. Denies gush of fluid, did note mucus d/c this am on tissue. Did not call the office, was scared because she is already dilated.

## 2012-03-26 ENCOUNTER — Other Ambulatory Visit: Payer: Medicaid Other

## 2012-03-26 ENCOUNTER — Ambulatory Visit (INDEPENDENT_AMBULATORY_CARE_PROVIDER_SITE_OTHER): Payer: Medicaid Other | Admitting: Obstetrics and Gynecology

## 2012-03-26 ENCOUNTER — Encounter: Payer: Self-pay | Admitting: Obstetrics and Gynecology

## 2012-03-26 VITALS — BP 132/80 | Wt 197.0 lb

## 2012-03-26 DIAGNOSIS — O3430 Maternal care for cervical incompetence, unspecified trimester: Secondary | ICD-10-CM

## 2012-03-26 DIAGNOSIS — I10 Essential (primary) hypertension: Secondary | ICD-10-CM

## 2012-03-26 DIAGNOSIS — O10919 Unspecified pre-existing hypertension complicating pregnancy, unspecified trimester: Secondary | ICD-10-CM

## 2012-03-26 DIAGNOSIS — O10019 Pre-existing essential hypertension complicating pregnancy, unspecified trimester: Secondary | ICD-10-CM

## 2012-03-26 LAB — CULTURE, BETA STREP (GROUP B ONLY)

## 2012-03-26 NOTE — Progress Notes (Signed)
[redacted]w[redacted]d Currently at home on bedrest. GC, Chlamydia, beta strep negative from last visit. Nonstress test reactive. Cervix was noted to be 3 cm dilated at last visit. Biophysical profile and ultrasound next visit. Return office in 1 week. Dr. Stefano Gaul

## 2012-03-30 ENCOUNTER — Encounter: Payer: Medicaid Other | Admitting: Obstetrics and Gynecology

## 2012-03-31 ENCOUNTER — Ambulatory Visit (INDEPENDENT_AMBULATORY_CARE_PROVIDER_SITE_OTHER): Payer: Medicaid Other

## 2012-03-31 ENCOUNTER — Encounter: Payer: Self-pay | Admitting: Obstetrics and Gynecology

## 2012-03-31 ENCOUNTER — Encounter: Payer: Medicaid Other | Admitting: Obstetrics and Gynecology

## 2012-03-31 ENCOUNTER — Ambulatory Visit (INDEPENDENT_AMBULATORY_CARE_PROVIDER_SITE_OTHER): Payer: Medicaid Other | Admitting: Obstetrics and Gynecology

## 2012-03-31 ENCOUNTER — Other Ambulatory Visit: Payer: Self-pay | Admitting: Obstetrics and Gynecology

## 2012-03-31 VITALS — BP 120/82 | Wt 200.0 lb

## 2012-03-31 DIAGNOSIS — O10019 Pre-existing essential hypertension complicating pregnancy, unspecified trimester: Secondary | ICD-10-CM

## 2012-03-31 DIAGNOSIS — I10 Essential (primary) hypertension: Secondary | ICD-10-CM

## 2012-03-31 DIAGNOSIS — O3430 Maternal care for cervical incompetence, unspecified trimester: Secondary | ICD-10-CM

## 2012-03-31 DIAGNOSIS — O10919 Unspecified pre-existing hypertension complicating pregnancy, unspecified trimester: Secondary | ICD-10-CM

## 2012-03-31 NOTE — Progress Notes (Signed)
[redacted]w[redacted]d CHTN on procardia TID, BP stable  Reports some irregular ctx, can't time them On BR at home,  No change in discharge, denies VB or LOF [place] today  BPP 8/8  vtx AFI 75%  Cervical length 0.7cm   rv'd w DR Normand Sloop Continue procardia and BR rv'd PTL precautions and [facility]KC RTO next week, 2 NST and ROB  Growth [place] to be done week of Dec 9th

## 2012-03-31 NOTE — Progress Notes (Signed)
[redacted]w[redacted]d No concerns today per pt

## 2012-04-07 ENCOUNTER — Ambulatory Visit: Payer: Medicaid Other

## 2012-04-07 ENCOUNTER — Ambulatory Visit (INDEPENDENT_AMBULATORY_CARE_PROVIDER_SITE_OTHER): Payer: Medicaid Other | Admitting: Obstetrics and Gynecology

## 2012-04-07 VITALS — BP 124/80 | Wt 198.0 lb

## 2012-04-07 DIAGNOSIS — R809 Proteinuria, unspecified: Secondary | ICD-10-CM

## 2012-04-07 DIAGNOSIS — O10019 Pre-existing essential hypertension complicating pregnancy, unspecified trimester: Secondary | ICD-10-CM

## 2012-04-07 DIAGNOSIS — O10919 Unspecified pre-existing hypertension complicating pregnancy, unspecified trimester: Secondary | ICD-10-CM

## 2012-04-07 NOTE — Progress Notes (Signed)
[redacted]w[redacted]d Pt has no complaints today. Feels BP is better conttrolled on Procardia Pt had NST today-Hypertension. Reactive Will have NST ON 04/10/12 and return 24 hr urine.  Will obtain PIH labs same day Wants tubal pp. Consent signed today Growth U/S and BPP 1 wk Plan induction at 39 wks if no medical reason prior to that

## 2012-04-10 ENCOUNTER — Encounter (HOSPITAL_COMMUNITY): Payer: Self-pay | Admitting: *Deleted

## 2012-04-10 ENCOUNTER — Encounter (HOSPITAL_COMMUNITY): Payer: Self-pay

## 2012-04-10 ENCOUNTER — Inpatient Hospital Stay (HOSPITAL_COMMUNITY)
Admission: AD | Admit: 2012-04-10 | Discharge: 2012-04-12 | DRG: 774 | Disposition: A | Discharge status: Home / Self Care | Payer: Medicaid Other | Source: Ambulatory Visit | Attending: Obstetrics and Gynecology | Admitting: Obstetrics and Gynecology

## 2012-04-10 ENCOUNTER — Inpatient Hospital Stay (HOSPITAL_COMMUNITY): Payer: Medicaid Other | Admitting: Anesthesiology

## 2012-04-10 ENCOUNTER — Other Ambulatory Visit: Payer: Medicaid Other

## 2012-04-10 ENCOUNTER — Encounter (HOSPITAL_COMMUNITY): Payer: Self-pay | Admitting: Anesthesiology

## 2012-04-10 DIAGNOSIS — R809 Proteinuria, unspecified: Secondary | ICD-10-CM

## 2012-04-10 DIAGNOSIS — O99519 Diseases of the respiratory system complicating pregnancy, unspecified trimester: Secondary | ICD-10-CM | POA: Diagnosis present

## 2012-04-10 DIAGNOSIS — O09529 Supervision of elderly multigravida, unspecified trimester: Secondary | ICD-10-CM

## 2012-04-10 DIAGNOSIS — O4290 Premature rupture of membranes, unspecified as to length of time between rupture and onset of labor, unspecified weeks of gestation: Secondary | ICD-10-CM | POA: Diagnosis present

## 2012-04-10 DIAGNOSIS — L0293 Carbuncle, unspecified: Secondary | ICD-10-CM

## 2012-04-10 DIAGNOSIS — L0292 Furuncle, unspecified: Secondary | ICD-10-CM

## 2012-04-10 DIAGNOSIS — J45909 Unspecified asthma, uncomplicated: Secondary | ICD-10-CM

## 2012-04-10 DIAGNOSIS — O1002 Pre-existing essential hypertension complicating childbirth: Principal | ICD-10-CM | POA: Diagnosis present

## 2012-04-10 DIAGNOSIS — O09299 Supervision of pregnancy with other poor reproductive or obstetric history, unspecified trimester: Secondary | ICD-10-CM

## 2012-04-10 DIAGNOSIS — O10919 Unspecified pre-existing hypertension complicating pregnancy, unspecified trimester: Secondary | ICD-10-CM

## 2012-04-10 LAB — URINALYSIS, ROUTINE W REFLEX MICROSCOPIC
Bilirubin Urine: NEGATIVE
Ketones, ur: NEGATIVE mg/dL
Nitrite: NEGATIVE
Protein, ur: 30 mg/dL — AB
pH: 6 (ref 5.0–8.0)

## 2012-04-10 LAB — URIC ACID: Uric Acid, Serum: 4.2 mg/dL (ref 2.4–7.0)

## 2012-04-10 LAB — URINE MICROSCOPIC-ADD ON

## 2012-04-10 LAB — COMPREHENSIVE METABOLIC PANEL
AST: 16 U/L (ref 0–37)
BUN: 11 mg/dL (ref 6–23)
CO2: 20 mEq/L (ref 19–32)
Calcium: 9.5 mg/dL (ref 8.4–10.5)
Chloride: 102 mEq/L (ref 96–112)
Creatinine, Ser: 0.61 mg/dL (ref 0.50–1.10)
GFR calc Af Amer: >90 mL/min (ref >90)
GFR calc non Af Amer: >90 mL/min (ref >90)
Glucose, Bld: 105 mg/dL — ABNORMAL HIGH (ref 70–99)
Total Bilirubin: 0.2 mg/dL — ABNORMAL LOW (ref 0.3–1.2)

## 2012-04-10 LAB — CBC
HCT: 33 % — ABNORMAL LOW (ref 36.0–46.0)
MCV: 87.1 fL (ref 78.0–100.0)
Platelets: 394 10*3/uL (ref 150–400)
RBC: 3.79 MIL/uL — ABNORMAL LOW (ref 3.87–5.11)
WBC: 13.1 10*3/uL — ABNORMAL HIGH (ref 4.0–10.5)

## 2012-04-10 MED ORDER — WITCH HAZEL-GLYCERIN EX PADS: 1.0000 "application " | MEDICATED_PAD | CUTANEOUS | Status: DC | PRN

## 2012-04-10 MED ORDER — NIFEDIPINE ER 30 MG PO TB24
30.0000 mg | ORAL_TABLET | Freq: Once | ORAL | Status: AC
  Administered 2012-04-10: 30 mg via ORAL
  Filled 2012-04-10: qty 1

## 2012-04-10 MED ORDER — FENTANYL 2.5 MCG/ML BUPIVACAINE 1/10 % EPIDURAL INFUSION (WH - ANES)
INTRAMUSCULAR | Status: DC | PRN
  Administered 2012-04-10: 14 mL/h via EPIDURAL

## 2012-04-10 MED ORDER — MEDROXYPROGESTERONE ACETATE 150 MG/ML IM SUSP: 150.0000 mg | INTRAMUSCULAR | Status: DC | PRN

## 2012-04-10 MED ORDER — ONDANSETRON HCL 4 MG PO TABS: 4.0000 mg | ORAL_TABLET | ORAL | Status: DC | PRN

## 2012-04-10 MED ORDER — DIBUCAINE 1 % RE OINT: 1.0000 "application " | TOPICAL_OINTMENT | RECTAL | Status: DC | PRN

## 2012-04-10 MED ORDER — HYDRALAZINE HCL 20 MG/ML IJ SOLN: 5.0000 mg | INTRAMUSCULAR | Status: DC | PRN

## 2012-04-10 MED ORDER — PHENYLEPHRINE 40 MCG/ML (10ML) SYRINGE FOR IV PUSH (FOR BLOOD PRESSURE SUPPORT)
80.0000 ug | PREFILLED_SYRINGE | INTRAVENOUS | Status: DC | PRN
  Filled 2012-04-10: qty 2

## 2012-04-10 MED ORDER — INFLUENZA VIRUS VACC SPLIT PF IM SUSP: 0.5000 mL | Freq: Once | INTRAMUSCULAR | Status: DC

## 2012-04-10 MED ORDER — IBUPROFEN 600 MG PO TABS
600.0000 mg | ORAL_TABLET | Freq: Four times a day (QID) | ORAL | Status: DC
  Administered 2012-04-10 – 2012-04-12 (×4): 600 mg via ORAL

## 2012-04-10 MED ORDER — HYDROXYZINE HCL 50 MG/ML IM SOLN
50.0000 mg | Freq: Four times a day (QID) | INTRAMUSCULAR | Status: DC | PRN
  Filled 2012-04-10: qty 1

## 2012-04-10 MED ORDER — ZOLPIDEM TARTRATE 5 MG PO TABS: 5.0000 mg | ORAL_TABLET | Freq: Every evening | ORAL | Status: DC | PRN

## 2012-04-10 MED ORDER — SENNOSIDES-DOCUSATE SODIUM 8.6-50 MG PO TABS
2.0000 | ORAL_TABLET | Freq: Every day | ORAL | Status: DC
  Administered 2012-04-11: 2 via ORAL

## 2012-04-10 MED ORDER — BENZOCAINE-MENTHOL 20-0.5 % EX AERO: 1.0000 "application " | INHALATION_SPRAY | CUTANEOUS | Status: DC | PRN

## 2012-04-10 MED ORDER — ACETAMINOPHEN 325 MG PO TABS: 650.0000 mg | ORAL_TABLET | ORAL | Status: DC | PRN

## 2012-04-10 MED ORDER — OXYCODONE-ACETAMINOPHEN 5-325 MG PO TABS: 1.0000 | ORAL_TABLET | ORAL | Status: DC | PRN

## 2012-04-10 MED ORDER — EPHEDRINE 5 MG/ML INJ
10.0000 mg | INTRAVENOUS | Status: DC | PRN
  Filled 2012-04-10: qty 2

## 2012-04-10 MED ORDER — OXYTOCIN 40 UNITS IN LACTATED RINGERS INFUSION - SIMPLE MED
62.5000 mL/h | INTRAVENOUS | Status: DC
  Filled 2012-04-10: qty 1000

## 2012-04-10 MED ORDER — LACTATED RINGERS IV SOLN: 500.0000 mL | Freq: Once | INTRAVENOUS | Status: DC

## 2012-04-10 MED ORDER — OXYCODONE-ACETAMINOPHEN 5-325 MG PO TABS
1.0000 | ORAL_TABLET | ORAL | Status: DC | PRN
  Administered 2012-04-10 – 2012-04-11 (×2): 1 via ORAL
  Filled 2012-04-10 (×2): qty 1

## 2012-04-10 MED ORDER — IBUPROFEN 600 MG PO TABS
600.0000 mg | ORAL_TABLET | Freq: Four times a day (QID) | ORAL | Status: DC | PRN
  Administered 2012-04-10: 600 mg via ORAL
  Filled 2012-04-10 (×8): qty 1

## 2012-04-10 MED ORDER — LABETALOL HCL 5 MG/ML IV SOLN: 10.0000 mg | INTRAVENOUS | Status: DC | PRN

## 2012-04-10 MED ORDER — DIPHENHYDRAMINE HCL 50 MG/ML IJ SOLN: 12.5000 mg | INTRAMUSCULAR | Status: DC | PRN

## 2012-04-10 MED ORDER — LANOLIN HYDROUS EX OINT: TOPICAL_OINTMENT | CUTANEOUS | Status: DC | PRN

## 2012-04-10 MED ORDER — TETANUS-DIPHTH-ACELL PERTUSSIS 5-2.5-18.5 LF-MCG/0.5 IM SUSP: 0.5000 mL | Freq: Once | INTRAMUSCULAR | Status: DC

## 2012-04-10 MED ORDER — FENTANYL CITRATE 0.05 MG/ML IJ SOLN: 100.0000 ug | INTRAMUSCULAR | Status: DC | PRN

## 2012-04-10 MED ORDER — NIFEDIPINE ER 30 MG PO TB24
30.0000 mg | ORAL_TABLET | Freq: Once | ORAL | Status: DC
  Filled 2012-04-10: qty 1

## 2012-04-10 MED ORDER — OXYTOCIN BOLUS FROM INFUSION
500.0000 mL | INTRAVENOUS | Status: DC
  Administered 2012-04-10: 500 mL via INTRAVENOUS

## 2012-04-10 MED ORDER — ONDANSETRON HCL 4 MG/2ML IJ SOLN: 4.0000 mg | Freq: Four times a day (QID) | INTRAMUSCULAR | Status: DC | PRN

## 2012-04-10 MED ORDER — ONDANSETRON HCL 4 MG/2ML IJ SOLN: 4.0000 mg | INTRAMUSCULAR | Status: DC | PRN

## 2012-04-10 MED ORDER — FENTANYL 2.5 MCG/ML BUPIVACAINE 1/10 % EPIDURAL INFUSION (WH - ANES)
14.0000 mL/h | INTRAMUSCULAR | Status: DC
  Filled 2012-04-10: qty 125

## 2012-04-10 MED ORDER — SIMETHICONE 80 MG PO CHEW
80.0000 mg | CHEWABLE_TABLET | ORAL | Status: DC | PRN
  Administered 2012-04-10: 80 mg via ORAL

## 2012-04-10 MED ORDER — HYDROXYZINE HCL 50 MG PO TABS
50.0000 mg | ORAL_TABLET | Freq: Four times a day (QID) | ORAL | Status: DC | PRN
  Filled 2012-04-10: qty 1

## 2012-04-10 MED ORDER — PHENYLEPHRINE 40 MCG/ML (10ML) SYRINGE FOR IV PUSH (FOR BLOOD PRESSURE SUPPORT)
80.0000 ug | PREFILLED_SYRINGE | INTRAVENOUS | Status: DC | PRN
  Filled 2012-04-10: qty 5
  Filled 2012-04-10: qty 2

## 2012-04-10 MED ORDER — LIDOCAINE HCL (PF) 1 % IJ SOLN
INTRAMUSCULAR | Status: DC | PRN
  Administered 2012-04-10 (×2): 9 mL

## 2012-04-10 MED ORDER — EPHEDRINE 5 MG/ML INJ
10.0000 mg | INTRAVENOUS | Status: DC | PRN
  Filled 2012-04-10: qty 2
  Filled 2012-04-10: qty 4

## 2012-04-10 MED ORDER — MAGNESIUM HYDROXIDE 400 MG/5ML PO SUSP: 30.0000 mL | ORAL | Status: DC | PRN

## 2012-04-10 MED ORDER — DIPHENHYDRAMINE HCL 25 MG PO CAPS: 25.0000 mg | ORAL_CAPSULE | Freq: Four times a day (QID) | ORAL | Status: DC | PRN

## 2012-04-10 MED ORDER — MISOPROSTOL 200 MCG PO TABS
ORAL_TABLET | ORAL | Status: AC
  Filled 2012-04-10: qty 4

## 2012-04-10 MED ORDER — LACTATED RINGERS IV SOLN
INTRAVENOUS | Status: DC
  Administered 2012-04-10: 04:00:00 via INTRAVENOUS

## 2012-04-10 MED ORDER — MEASLES, MUMPS & RUBELLA VAC ~~LOC~~ INJ: 0.5000 mL | INJECTION | Freq: Once | SUBCUTANEOUS | Status: DC

## 2012-04-10 MED ORDER — PRENATAL MULTIVITAMIN CH
1.0000 | ORAL_TABLET | Freq: Every day | ORAL | Status: DC
  Administered 2012-04-10 – 2012-04-12 (×3): 1 via ORAL
  Filled 2012-04-10 (×3): qty 1

## 2012-04-10 MED ORDER — MISOPROSTOL 200 MCG PO TABS
800.0000 ug | ORAL_TABLET | Freq: Once | ORAL | Status: AC
  Administered 2012-04-10: 800 ug via RECTAL

## 2012-04-10 MED ORDER — LACTATED RINGERS IV SOLN: 500.0000 mL | INTRAVENOUS | Status: DC | PRN

## 2012-04-10 MED ORDER — CITRIC ACID-SODIUM CITRATE 334-500 MG/5ML PO SOLN
30.0000 mL | ORAL | Status: DC | PRN
  Administered 2012-04-10: 30 mL via ORAL
  Filled 2012-04-10: qty 15

## 2012-04-10 MED ORDER — LIDOCAINE HCL (PF) 1 % IJ SOLN
30.0000 mL | INTRAMUSCULAR | Status: DC | PRN
  Filled 2012-04-10 (×2): qty 30

## 2012-04-10 NOTE — Progress Notes (Signed)
Subjective: Comfortable s/p epidural; received about ago.  S.o. And oldest daughter at bedside.  Pt states she did not take her bedtime dose of Procardia last night.  When she is not pregnant, she takes Verapamil she states.  Objective: BP 139/72  Pulse 48  Temp 97.9 F (36.6 C) (Oral)  Resp 20  Ht 5\' 7"  (1.702 m)  Wt 197 lb (89.359 kg)  BMI 30.85 kg/m2  SpO2 98%  LMP 07/28/2011     .Marland Kitchen Filed Vitals:   04/10/12 0220 04/10/12 0221 04/10/12 0226 04/10/12 0227  BP: 120/68  133/99 139/72  Pulse: 48 44 74 48  Temp:      TempSrc:      Resp: 18  20 20   Height:      Weight:      SpO2: 98% 98%  98%   FHT:  FHR: 140 bpm, variability: moderate,  accelerations:  Present,  decelerations:  Present rare mild variable UC:   irregular, every 5-10 minutes SVE:   Dilation: 4.5 Effacement (%): 70 Station: -1 Exam by:: H.Ernestine Langworthy,CNM Deferred recheck at present Labs: .Marland Kitchen Results for orders placed during the hospital encounter of 04/10/12 (from the past 24 hour(s))  CBC     Status: Abnormal   Collection Time   04/10/12 12:30 AM      Component Value Range   WBC 13.1 (*) 4.0 - 10.5 K/uL   RBC 3.79 (*) 3.87 - 5.11 MIL/uL   Hemoglobin 11.0 (*) 12.0 - 15.0 g/dL   HCT 16.1 (*) 09.6 - 04.5 %   MCV 87.1  78.0 - 100.0 fL   MCH 29.0  26.0 - 34.0 pg   MCHC 33.3  30.0 - 36.0 g/dL   RDW 40.9  81.1 - 91.4 %   Platelets 394  150 - 400 K/uL  COMPREHENSIVE METABOLIC PANEL     Status: Abnormal   Collection Time   04/10/12 12:30 AM      Component Value Range   Sodium 134 (*) 135 - 145 mEq/L   Potassium 4.3  3.5 - 5.1 mEq/L   Chloride 102  96 - 112 mEq/L   CO2 20  19 - 32 mEq/L   Glucose, Bld 105 (*) 70 - 99 mg/dL   BUN 11  6 - 23 mg/dL   Creatinine, Ser 7.82  0.50 - 1.10 mg/dL   Calcium 9.5  8.4 - 95.6 mg/dL   Total Protein 6.7  6.0 - 8.3 g/dL   Albumin 2.6 (*) 3.5 - 5.2 g/dL   AST 16  0 - 37 U/L   ALT 11  0 - 35 U/L   Alkaline Phosphatase 148 (*) 39 - 117 U/L   Total Bilirubin 0.2 (*)  0.3 - 1.2 mg/dL   GFR calc non Af Amer >90  >90 mL/min   GFR calc Af Amer >90  >90 mL/min  LACTATE DEHYDROGENASE     Status: Normal   Collection Time   04/10/12 12:30 AM      Component Value Range   LDH 158  94 - 250 U/L  URIC ACID     Status: Normal   Collection Time   04/10/12 12:30 AM      Component Value Range   Uric Acid, Serum 4.2  2.4 - 7.0 mg/dL    Assessment / Plan: 1. 35 weeks 2. early labor s/p SROM at 0010 3. CHTN 4. GBS neg  Labor: Progressing normally Preeclampsia:  labs stable Fetal Wellbeing:  Category I Pain Control:  Epidural I/D:  n/a Anticipated MOD:  NSVD 1. Will recheck in 1-2 hrs or prn 2.  C/w MD prn Rachel Samples H 04/10/2012, 2:41 AM

## 2012-04-10 NOTE — Anesthesia Preprocedure Evaluation (Signed)
Anesthesia Evaluation  Patient identified by MRN, date of birth, ID band Patient awake    Reviewed: Allergy & Precautions, H&P , NPO status , Patient's Chart, lab work & pertinent test results  Airway Mallampati: II TM Distance: >3 FB Neck ROM: full    Dental No notable dental hx.    Pulmonary    Pulmonary exam normal       Cardiovascular     Neuro/Psych negative psych ROS   GI/Hepatic negative GI ROS, Neg liver ROS,   Endo/Other  negative endocrine ROS  Renal/GU negative Renal ROS  negative genitourinary   Musculoskeletal negative musculoskeletal ROS (+)   Abdominal Normal abdominal exam  (+)   Peds negative pediatric ROS (+)  Hematology negative hematology ROS (+)   Anesthesia Other Findings   Reproductive/Obstetrics (+) Pregnancy                           Anesthesia Physical Anesthesia Plan  ASA: II  Anesthesia Plan: Epidural   Post-op Pain Management:    Induction:   Airway Management Planned:   Additional Equipment:   Intra-op Plan:   Post-operative Plan:   Informed Consent: I have reviewed the patients History and Physical, chart, labs and discussed the procedure including the risks, benefits and alternatives for the proposed anesthesia with the patient or authorized representative who has indicated his/her understanding and acceptance.     Plan Discussed with:   Anesthesia Plan Comments:         Anesthesia Quick Evaluation

## 2012-04-10 NOTE — Anesthesia Procedure Notes (Addendum)
Epidural Patient location during procedure: OB Start time: 04/10/2012 1:55 AM End time: 04/10/2012 1:59 AM  Preanesthetic Checklist Completed: patient identified, site marked, surgical consent, pre-op evaluation, timeout performed, IV checked, risks and benefits discussed and monitors and equipment checked  Epidural Patient position: sitting Prep: site prepped and draped and DuraPrep Patient monitoring: continuous pulse ox and blood pressure Approach: midline Injection technique: LOR air  Needle:  Needle type: Tuohy  Needle gauge: 17 G Needle length: 9 cm and 9 Needle insertion depth: 5 cm cm Catheter type: closed end flexible Catheter size: 19 Gauge Catheter at skin depth: 10 cm Test dose: negative  Assessment Events: blood not aspirated, injection not painful, no injection resistance, negative IV test and no paresthesia  Additional Notes Reason for block:procedure for pain

## 2012-04-10 NOTE — MAU Note (Signed)
Pt states her water broke at 0010 clear fluid

## 2012-04-10 NOTE — Anesthesia Postprocedure Evaluation (Signed)
  Anesthesia Post-op Note  Patient: Danielle [name]  Procedure(s) Performed: * No procedures listed *  Patient Location: Mother/Baby  Anesthesia Type:Epidural  Level of Consciousness: awake  Airway and Oxygen Therapy: Patient Spontanous Breathing  Post-op Pain: mild  Post-op Assessment: Patient's Cardiovascular Status Stable and Respiratory Function Stable  Post-op Vital Signs: stable  Complications: No apparent anesthesia complications

## 2012-04-10 NOTE — Progress Notes (Signed)
Pt assisted to bathroom per steady.  Voided and assisted with pericare

## 2012-04-10 NOTE — Consult Note (Addendum)
Neonatology Note:   Attendance at Delivery:    I was asked to attend this NSVD at [redacted] weeks GA after SROM and onset of preterm labor. The mother is a G5P4 A pos, GBS neg with SROM and preterm labor. Mother received Betamethasone in November due to some cervical changes and positive fetal fibronectin. ROM 5 hours prior to delivery, fluid clear. Short cord noted. Infant vigorous with good spontaneous cry and tone. Needed only minimal bulb suctioning. Ap 8/9. Lungs clear to ausc in DR. I spoke with his parents in the DR and allowed the baby to remain with them for 10-15 minutes, then will go to CN for further transition due to prematurity. To CN to care of Pediatrician.   Deatra James, MD

## 2012-04-10 NOTE — Progress Notes (Signed)
CNM notified of UA results and maternal vital signs. Orders received.

## 2012-04-10 NOTE — H&P (Signed)
Danielle [name] is a 36 y.o.black female presenting unannounced for SROM at 0010.  No VB.  Fluid has been clear and large amounts.  GFM.  Denies UTI or PIH s/s.  Reports irregular ctxs for the last few weeks, but have become more painful and more frequent since SROM.  No recent illness or fever.  Accompanied by her s.o. And her oldest daughter.   Prenatal Course: Pt entered care at [redacted]w[redacted]d at MAU for 1st trimester VB.  NOB w/u at [facility] at [redacted]w[redacted]d.  At that visit c/o draining hydradenitis lesion of Rt axilla, and Rx'd Keflex.  Pregnancy has been complicated by [facility], proteinuria, and preterm cervical dilatation.  Pt took Verapamil prior to pregnancy and then switched to Labetalol during pregnancy.  She had negative 1st trimester screen and AFP.  Pt had proteinuria on numerous voided specimens, and suspected to be r/t CHTN.  She did a 24 hr urine early second trimester and 50mg  total protein.  Around that same time c/o increased fatigue and w/u WNL (TSH, CBC) except Vit D slightly low=25.  Normal anatomy u/s at [redacted]w[redacted]d w/ exception of hypocoiled umbilical cord.  At that time, started to c/o light-headedness which she associated w/ Labetalol and fluctuations in BP.  Rx'd Macrobid for UTI at [redacted]w[redacted]d, and at that time Labetalol changed to lower dose in AM, and higher dose in afternoon and PM.  Repeat 24 hr urine on 02/04/12 showed 368mg  total protein, and pt referred to MFM for proposed POC; expectant management rec'd unless other s/s of of superimposed PreEclampsia arose.  Pt started to have worsening asthma around 27 weeks, and referred to pulmonologist who felt maybe r/t Labetalol use, and he rec'd Procardia 10mg  po tid, which she has continued with good control and less SE since.  GERD worsening in 3rd trimester, and currently uses "Tums" prn.  Pt has received serial growth u/s during pregnancy secondary to [facility] and last EFW=63% at [redacted]w[redacted]d.  Short cervix noted on that u/s w/ membranes at level os.  On 03/24/12, cervix 3cm  dilated on SVE, and FFN positive.  She received BM on 11/18 and 11/19 and placed on bedrest.  24 hr urine repeated on 03/24/12, and =230mg  total protein.  Pt signed 30-d papers for PP BTL on 04/07/12.    OB Hx: G1=SVD '92 at 41 weeks, F=7+7 G2=SVD '95 at [redacted]w[redacted]d, F=7+4 (PTL at 26 weeks G3=SVD '01 at [redacted]w[redacted]d, F=6+15 (4hr labor) G4=SVD '04 IOL at 37 weeks for PreEclampsia, F=6+7 G5=current  Maternal Medical History:  Reason for admission: Reason for admission: rupture of membranes and contractions.  Contractions: Onset was 1-2 hours ago.   Frequency: irregular.   Perceived severity is moderate.    Fetal activity: Perceived fetal activity is normal.   Last perceived fetal movement was within the past hour.    Prenatal complications: Hypertension and preterm labor.     OB History    Grav Para Term Preterm Abortions TAB SAB Ect Mult Living   5 4 4  0      4     Past Medical History  Diagnosis Date  . Trichimoniasis   . Yeast infection   . Preterm labor 1995  . Anemia     WITH PREGNANCY  . Asthma 08/2011    ALBUTEROL  INHALER PRN  . Infection     UTI X 1  . Headache     MIGRAINES RESOLVED  . Arm fracture AGE 36  . Hypertension 2001  . Pregnancy induced hypertension  Past Surgical History  Procedure Date  . Wisdom tooth extraction    Family History: family history includes Cancer in her paternal grandfather and paternal grandmother; Diabetes in her maternal grandfather, mother, and paternal aunt; Drug abuse in her mother; Heart disease in her maternal grandfather; Hypertension in her maternal grandfather, mother, paternal grandfather, and sister; and Stroke in her paternal grandfather. Social History:  reports that she has been smoking Cigarettes.  She has a 5 pack-year smoking history. She has never used smokeless tobacco. She reports that she does not drink alcohol or use illicit drugs.   Prenatal Transfer Tool  Maternal Diabetes: No Genetic Screening: Normal Maternal  Ultrasounds/Referrals: Normal Fetal Ultrasounds or other Referrals:  Referred to Materal Fetal Medicine  Maternal Substance Abuse:  Yes:  Type: Smoker Significant Maternal Medications:  Meds include: Other:  Significant Maternal Lab Results:  Lab values include: Group B Strep negative Other Comments:  Procardia 10mg  po tid for CHTN; hypocoiled umbilical cord  Review of Systems  Constitutional: Negative.   HENT: Negative.   Eyes: Negative.   Respiratory: Negative.   Cardiovascular: Negative.   Gastrointestinal: Positive for constipation.       Took "suppository" last night before bed, but no result  Skin: Negative.   Neurological: Negative.     Dilation: 4.5 Effacement (%): 70 Station: -1 Exam by:: H.Asher Torpey,CNM Blood pressure 142/84, pulse 48, temperature 97.9 F (36.6 C), temperature source Oral, resp. rate 18, height 5\' 7"  (1.702 m), weight 197 lb (89.359 kg), last menstrual period 07/28/2011, SpO2 100.00%. Maternal Exam:  Uterine Assessment: Contraction strength is moderate.  Contraction frequency is irregular.  uc's q 6-12 min  Abdomen: Patient reports no abdominal tenderness. Fetal presentation: vertex  Introitus: Ferning test: not done.  Nitrazine test: not done. Amniotic fluid character: clear.  Pelvis: adequate for delivery.   Cervix: Cervix evaluated by digital exam.     Fetal Exam Fetal Monitor Review: Variability: moderate (6-25 bpm).   Pattern: accelerations present and no decelerations.    Fetal State Assessment: Category I - tracings are normal.     Physical Exam  Constitutional: She is oriented to person, place, and time. She appears well-developed and well-nourished.       Anxious, but not breathing or writhing w/ ctxs  HENT:  Head: Normocephalic and atraumatic.  Eyes: Pupils are equal, round, and reactive to light.  Cardiovascular: Normal rate.   Respiratory: Effort normal.  GI: Soft.       gravid  Genitourinary:       Cx: 4.5/75/-1,  grossly ruptured, clear fluid  Musculoskeletal: She exhibits no edema.  Neurological: She is alert and oriented to person, place, and time. She has normal reflexes.  Skin: Skin is warm and dry.  Psychiatric: Her behavior is normal. Judgment and thought content normal.    Prenatal labs: ABO, Rh: A/POS/-- (05/22 1008) Antibody: NEG (05/22 1008) Rubella: 89.9 (05/22 1008) RPR: NON REAC (10/15 1113)  HBsAg: NEGATIVE (05/22 1008)  HIV: NON REACTIVE (05/22 1008)  GBS: Negative (11/18 0000)  1hr gtt=84  Assessment/Plan: 1. [redacted]w[redacted]d 2. SROM at 0010 3. PPROM vs early labor 4. GBS neg 03/23/12 5. CHTN-on Procardia 10mg  po tid 6. H/o proteinuria  7. Cat I FHT 8. cx 3cm at last exam 9. S/p BMZ 11/18 & 11/19 for pos FFN and advanced dilatation 10. asthma  1. Admit to [facility] w/ Dr. Pennie Rushing as attending 2. Routine L&D orders with PIH labs 3. Epidural per pt request 4. Pitocin prn augmentation 5.  NICU notified 6. C/w MD prn 7. Anticipate SVD   Donovon Micheletti H 04/10/2012, 1:49 AM

## 2012-04-10 NOTE — Progress Notes (Signed)
CNM on the phone and notified of FHR change, deceleration and RN interventions. Will continue to monitor.

## 2012-04-10 NOTE — Progress Notes (Signed)
Subjective: Pt feels intermittent pressure. Family asleep at bedside.  No other c/o's.    Objective: BP 121/72  Pulse 63  Temp 97.8 F (36.6 C) (Oral)  Resp 20  Ht 5\' 7"  (1.702 m)  Wt 197 lb (89.359 kg)  BMI 30.85 kg/m2  SpO2 98%  LMP 07/28/2011   Total I/O In: -  Out: 275 [Urine:275] .Marland Kitchen Filed Vitals:   04/10/12 0230 04/10/12 0300 04/10/12 0330 04/10/12 0400  BP: 136/91 129/80 131/87 121/72  Pulse: 89 49 94 63  Temp:  97.8 F (36.6 C)    TempSrc:  Oral    Resp: 18 18 18 20   Height:      Weight:      SpO2:       FHT:  FHR: 135 bpm, variability: moderate,  accelerations:  Abscent,  decelerations:  Present intermittent earlies/variables/rare late UC:   irregular, every 2-7 minutes SVE:   Dilation: 5.5 Effacement (%): 80 Station: -1 Exam by:: H. Lucerito Rosinski CNM Small amt of bloody show  Labs: Lab Results  Component Value Date   WBC 13.1* 04/10/2012   HGB 11.0* 04/10/2012   HCT 33.0* 04/10/2012   MCV 87.1 04/10/2012   PLT 394 04/10/2012    Assessment / Plan: 1. 35weeks 2. spontaneous labor after SROM at 0010 3. GBS neg 4. CHTN  Labor: Progressing normally Preeclampsia:  labs stable Fetal Wellbeing:  Category II Pain Control:  Epidural I/D:  n/a Anticipated MOD:  NSVD 1. Continue expectant management; pitocin prn. 2. C/w MD prn 3.  Plan to have NICU present at delivery [facility] H 04/10/2012, 4:47 AM

## 2012-04-10 NOTE — Progress Notes (Signed)
UR chart review completed.  

## 2012-04-11 LAB — COMPREHENSIVE METABOLIC PANEL
Albumin: 2.4 g/dL — ABNORMAL LOW (ref 3.5–5.2)
BUN: 9 mg/dL (ref 6–23)
Calcium: 9.4 mg/dL (ref 8.4–10.5)
Creatinine, Ser: 0.59 mg/dL (ref 0.50–1.10)
Total Protein: 6.1 g/dL (ref 6.0–8.3)

## 2012-04-11 LAB — URIC ACID: Uric Acid, Serum: 4.1 mg/dL (ref 2.4–7.0)

## 2012-04-11 LAB — CBC
HCT: 28.9 % — ABNORMAL LOW (ref 36.0–46.0)
Hemoglobin: 9.6 g/dL — ABNORMAL LOW (ref 12.0–15.0)
MCH: 28.8 pg (ref 26.0–34.0)
MCHC: 33.2 g/dL (ref 30.0–36.0)

## 2012-04-11 LAB — CREATININE CLEARANCE, URINE, 24 HOUR
Creatinine Clearance: 193 mL/min — ABNORMAL HIGH (ref 75–115)
Creatinine, 24H Ur: 1640 mg/d (ref 700–1800)
Urine Total Volume-CRCL: 3000 mL

## 2012-04-11 LAB — PROTEIN, URINE, 24 HOUR
Collection Interval-UPROT: 24 hours
Protein, Urine: 7 mg/dL
Urine Total Volume-UPROT: 3000 mL

## 2012-04-11 MED ORDER — NIFEDIPINE ER 30 MG PO TB24
30.0000 mg | ORAL_TABLET | Freq: Every day | ORAL | Status: DC
  Administered 2012-04-11 – 2012-04-12 (×2): 30 mg via ORAL
  Filled 2012-04-11 (×2): qty 1

## 2012-04-11 NOTE — Progress Notes (Signed)
Post Partum Day 1 Subjective: no complaints, voiding and tolerating PO.  Pt trying to BF but let down not coming in good yet.  Still plans interval tubal but wanted to hold off on doing it PP.  Plans in office circ.  Denies HA, visual changes or abdominal pain.  Objective: Blood pressure 132/84, pulse 79, temperature 98 F (36.7 C), temperature source Oral, resp. rate 20, height 5\' 7"  (1.702 m), weight 197 lb (89.359 kg), last menstrual period 07/28/2011, SpO2 99.00%, unknown if currently breastfeeding.  Physical Exam:  General: alert and no distress Lochia: appropriate Uterine Fundus: firm DVT Evaluation: No evidence of DVT seen on physical exam. No cords or calf tenderness.   Basename 04/11/12 0515 04/10/12 0030  HGB 9.6* 11.0*  HCT 28.9* 33.0*   24hr urine neg (210) Gest htn labs wnl  Assessment/Plan: Plan for discharge tomorrow Breastfeeding Lactation consult Contraception - still planning on having tubal but decided to do it after pp visit because she thought it might be more difficult to take care of the baby initially as she has no help.  Her husband she says is returning to work immediately.  She will discuss at [facility] visit.  BC options reviewed. No s/sxs of preeclampsia and CHTN controlled on procardia xl 30mg  qd   LOS: 1 day   Gervis Gaba Y 04/11/2012, 11:38 AM

## 2012-04-11 NOTE — Progress Notes (Signed)
Steelman, CNM notified of pt status and ending of 24hr urine.  Pt also needed procardia order continued.  Pt stable with wanting IV and Foley d/c'd. To call Lillard, CNM on needed orders til 7pm.

## 2012-04-12 MED ORDER — INTEGRA PLUS PO CAPS: 1.0000 | ORAL_CAPSULE | Freq: Every day | ORAL | Status: DC

## 2012-04-12 MED ORDER — NIFEDIPINE ER 30 MG PO TB24: 30.0000 mg | ORAL_TABLET | Freq: Every day | ORAL | Status: DC

## 2012-04-12 MED ORDER — IBUPROFEN 600 MG PO TABS: 600.0000 mg | ORAL_TABLET | Freq: Four times a day (QID) | ORAL | Status: DC | PRN

## 2012-04-15 ENCOUNTER — Other Ambulatory Visit: Payer: Medicaid Other

## 2012-04-15 ENCOUNTER — Encounter: Payer: Medicaid Other | Admitting: Obstetrics and Gynecology

## 2012-04-26 NOTE — Discharge Summary (Signed)
  Vaginal Delivery Discharge Summary  Danielle [name]  DOB:    05-15-1975 MRN:    [id number] CSN:    [id number]  Date of admission:                  04/10/2012  Date of discharge:                   04/12/2012  Procedures this admission:  SVD, epidural   Newborn Data:  Live born female  Birth Weight: 5 lb 1 oz (2295 g) APGAR: 8, 9  Home with mother.   History of Present Illness:  Ms. Danielle [name] is a 36 y.o. female, [phone], who presents at [redacted]w[redacted]d weeks gestation. The patient has been followed at the [facility] Gy[facility]cology division of [facility]. She was admitted onset of labor and rupture of membranes. Her pregnancy has been complicated by: +FFN, and preterm ctx.  Hospital course:  The patient was admitted for PPROM.    Her labor was complicated. She proceeded to have a vaginal delivery of a healthy infant at 35wks by. C.Steelman, CNM. Her delivery was not complicated. Her postpartum course was not complicated.  She was discharged to home on postpartum day 2 doing well.  Feeding:  breast  Contraception:  desires, PP BTL, after 6wks  Discharge hemoglobin:  Hemoglobin  Date Value Range Status  04/11/2012 9.6* 12.0 - 15.0 g/dL Final     HCT  Date Value Range Status  04/11/2012 28.9* 36.0 - 46.0 % Final    Discharge Physical Exam:   General: no distress Lochia: appropriate Uterine Fundus: firm Incision: healing well DVT Evaluation: No evidence of DVT seen on physical exam.  Intrapartum Procedures: spontaneous vaginal delivery and epidural Postpartum Procedures: none Complications-Operative and Postpartum: none  Discharge Diagnoses: preterm labor, delivered  Discharge Information:  Activity:           pelvic rest Diet:                routine and iron-rich Medications: PNV and Ibuprofen Condition:      stable Instructions:  refer to practice specific booklet Discharge to: home  Follow-up Information    Follow  up with [facility]. In 5 weeks. (A nurse will call you and set up a time to come check your blood pressure at home in a couple of days )    Contact information:   [address] [place] [address] [phone]          Danielle [name] 04/26/2012

## 2012-05-13 ENCOUNTER — Ambulatory Visit (INDEPENDENT_AMBULATORY_CARE_PROVIDER_SITE_OTHER): Payer: Medicaid Other | Admitting: Obstetrics and Gynecology

## 2012-05-13 ENCOUNTER — Encounter: Payer: Self-pay | Admitting: Obstetrics and Gynecology

## 2012-05-13 NOTE — Progress Notes (Signed)
Date of delivery: 04/10/2012 Female Name: Danielle [name]  Vaginal delivery:yes Cesarean section:no Tubal ligation:no GDM:no Breast Feeding:no Bottle Feeding:yes Post-Partum Blues:no Abnormal pap:no Normal GU function: yes Normal GI function:yes Returning to work:yes EPDS:0

## 2012-05-14 ENCOUNTER — Telehealth: Payer: Self-pay | Admitting: Obstetrics and Gynecology

## 2012-05-14 NOTE — Telephone Encounter (Signed)
Try calling pt rgd msg no answer unable to leave msg 

## 2012-06-01 ENCOUNTER — Other Ambulatory Visit: Payer: Self-pay | Admitting: Obstetrics and Gynecology

## 2012-06-01 NOTE — Telephone Encounter (Signed)
Pt called regarding Rx request. Pt states she has not called the office regarding any Keflex Rx. Pt has not had a recent appt.   [facility] CMA

## 2012-06-01 NOTE — Telephone Encounter (Signed)
avs pt 

## 2012-06-12 ENCOUNTER — Telehealth: Payer: Self-pay | Admitting: Obstetrics and Gynecology

## 2012-06-20 ENCOUNTER — Other Ambulatory Visit: Payer: Self-pay

## 2012-07-15 ENCOUNTER — Telehealth: Payer: Self-pay | Admitting: Obstetrics and Gynecology

## 2012-07-29 ENCOUNTER — Telehealth: Payer: Self-pay | Admitting: Obstetrics and Gynecology

## 2012-11-07 IMAGING — US US OB DETAIL+14 WK
1 series · 12 of 28 positions shown · non-contrast
Comparison: none

[Series 1: us ob detail+14 wk · 0.30mm/px · 12 of 85 slices shown]
[im 4/85]
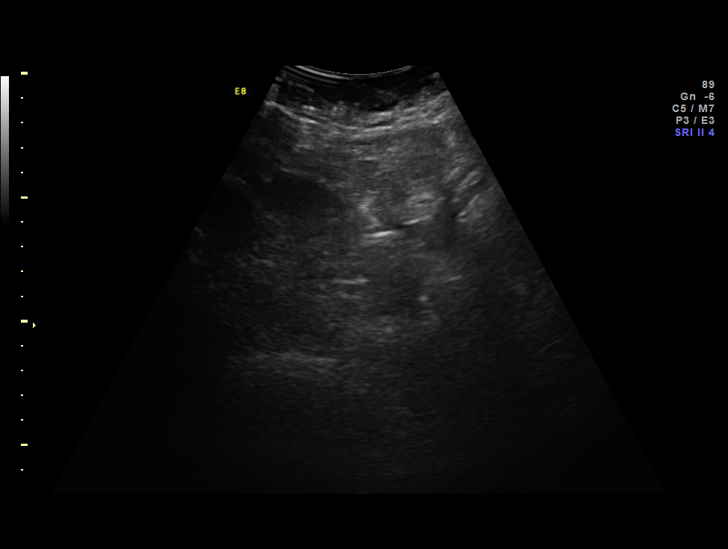
[im 10/85]
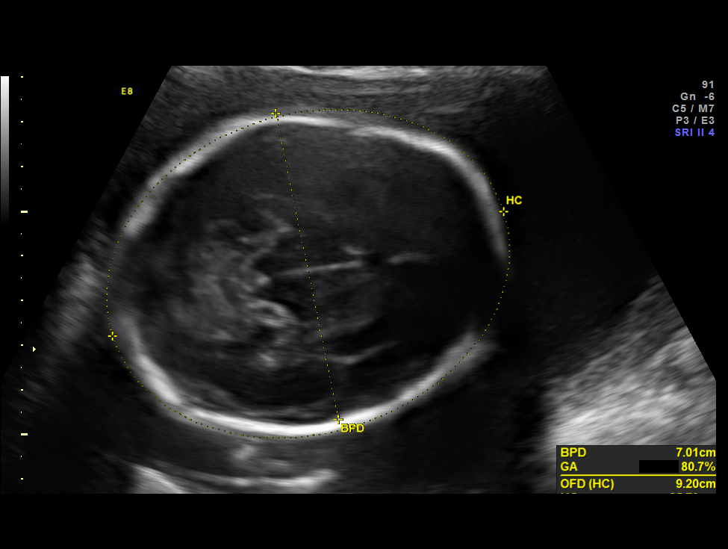
[im 16/85]
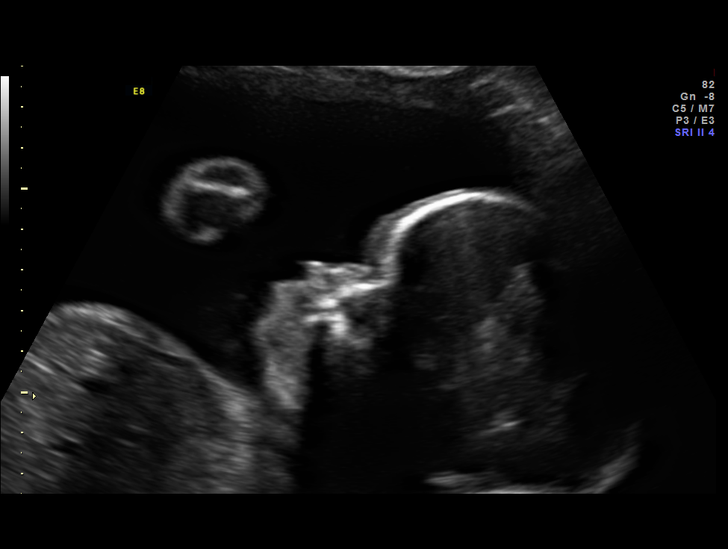
[im 25/85]
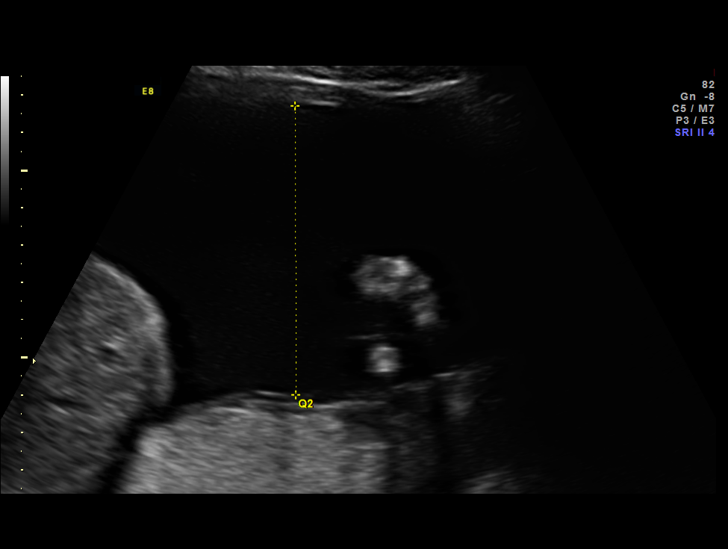
[im 32/85]
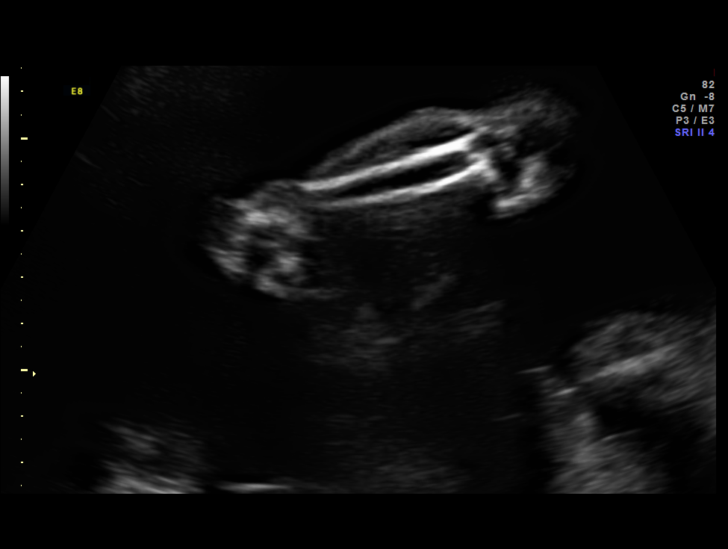
[im 38/85]
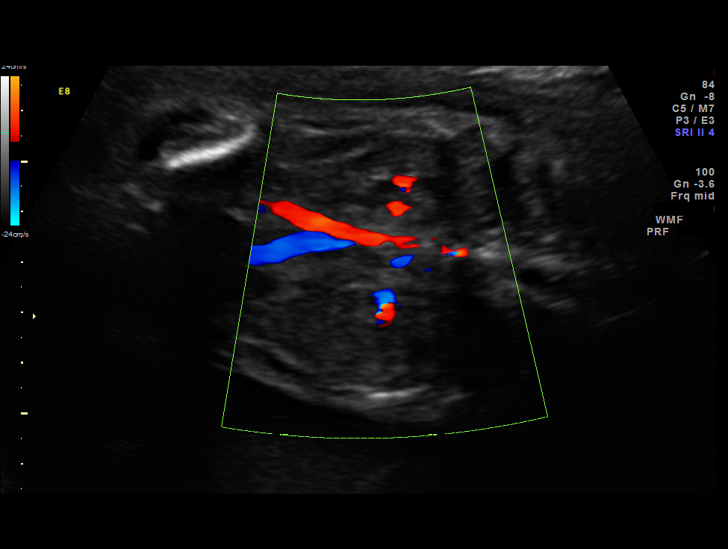
[im 47/85]
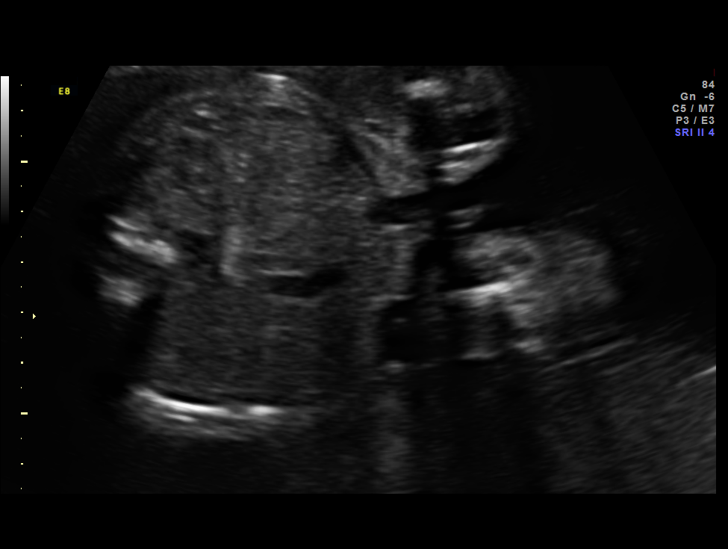
[im 53/85]
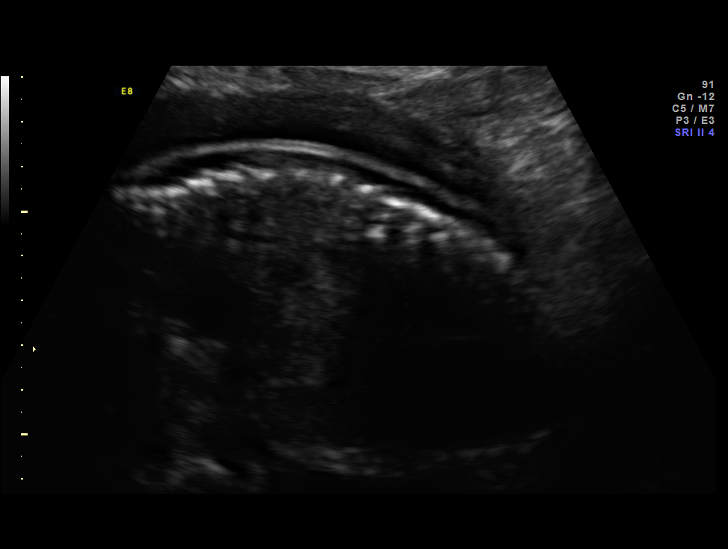
[im 60/85]
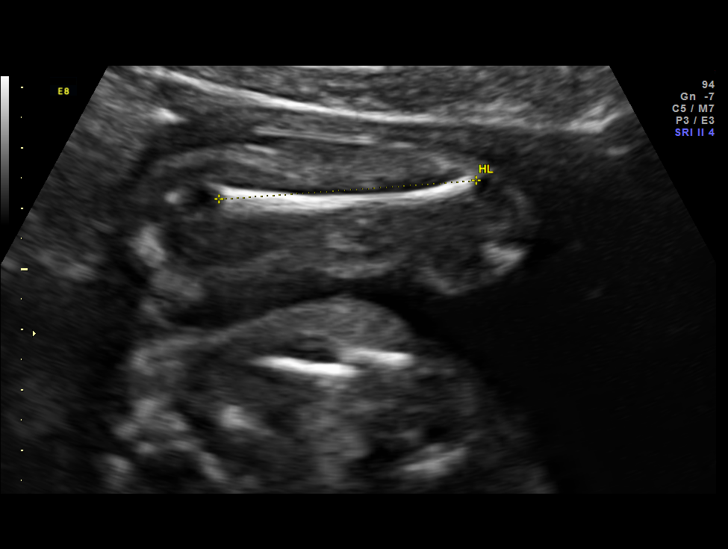
[im 69/85]
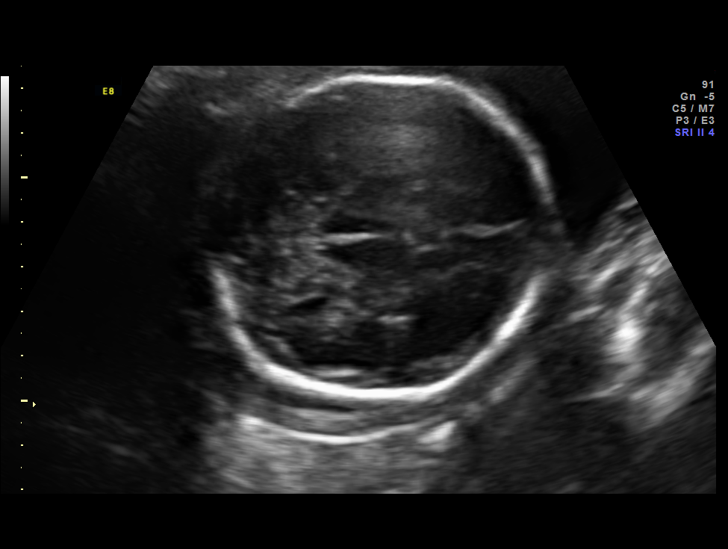
[im 75/85]
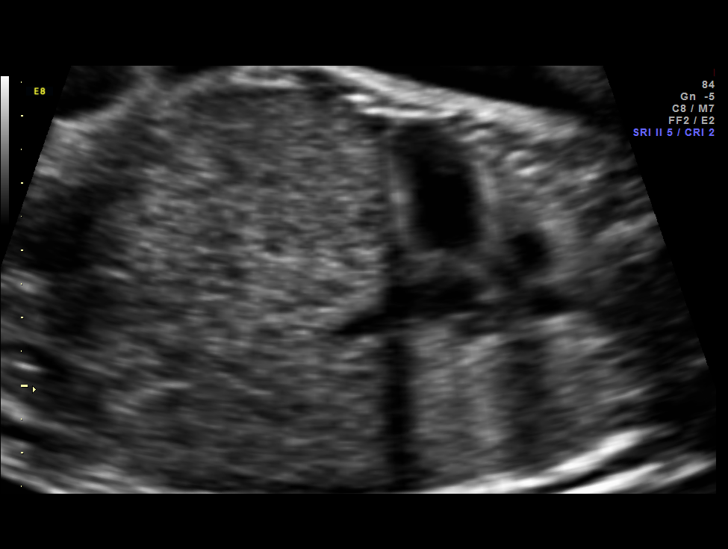
[im 81/85]
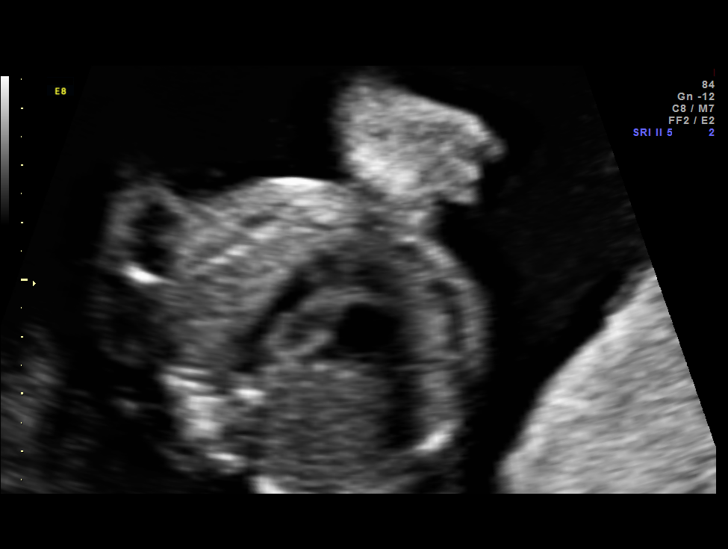

[12 of 28 positions shown; findings below may reference images not displayed]

OBSTETRICS REPORT
                      (Signed Final 02/13/2012 [DATE])

Service(s) Provided

 US OB DETAIL + 14 WK                                  76811.0
Indications

 Detailed fetal anatomic survey
 Advanced maternal age (AMA), Multigravida
 Hypertension - Chronic/Pre-existing
 Poor obstetric history: Previous preeclampsia /
 eclampsia/gestational HTN
Fetal Evaluation

 Num Of Fetuses:    1
 Fetal Heart Rate:  122                          bpm
 Cardiac Activity:  Observed
 Presentation:      Cephalic
 Placenta:          Posterior, above cervical
                    os
 P. Cord            Visualized
 Insertion:

 Amniotic Fluid
 AFI FV:      Subjectively increased
 AFI Sum:     29.42   cm     > 97  %Tile     Larg Pckt:    8.42  cm
 RUQ:   6.67    cm   RLQ:    6.61   cm    LUQ:   7.72    cm   LLQ:    8.42   cm
Biometry

 BPD:     69.8  mm     G. Age:  28w 0d                CI:         76.0   70 - 86
 OFD:     91.8  mm                                    FL/HC:      18.4   18.6 -

 HC:     259.4  mm     G. Age:  28w 2d       67  %    HC/AC:      1.15   1.05 -

 AC:       226  mm     G. Age:  27w 0d       46  %    FL/BPD:     68.3   71 - 87
 FL:      47.7  mm     G. Age:  26w 0d       14  %    FL/AC:      21.1   20 - 24
 HUM:     42.6  mm     G. Age:  25w 4d       15  %

 Est. FW:     986  gm      2 lb 3 oz     52  %
Gestational Age

 U/S Today:     27w 2d                                        EDD:   05/12/12
 Best:          26w 6d     Det. By:  U/S G S (09/13/11)       EDD:   05/15/12
Anatomy

 Cranium:          Appears normal         Aortic Arch:      Appears normal
 Fetal Cavum:      Appears normal         Ductal Arch:      Appears normal
 Ventricles:       Appears normal         Diaphragm:        Appears normal
 Choroid Plexus:   Appears normal         Stomach:          Appears normal, left
                                                            sided
 Cerebellum:       Appears normal         Abdomen:          Appears normal
 Posterior Fossa:  Appears normal         Abdominal Wall:   Appears nml (cord
                                                            insert, abd wall)
 Nuchal Fold:      Not applicable (>20    Cord Vessels:     Appears normal (3
                   wks GA)                                  vessel cord)
 Face:             Appears normal         Kidneys:          Appear normal
                   (orbits and profile)
 Lips:             Appears normal         Bladder:          Appears normal
 Heart:            Appears normal         Spine:            Appears normal
                   (4CH, axis, and
                   situs)
 RVOT:             Appears normal         Lower             Appears normal
                                          Extremities:
 LVOT:             Appears normal         Upper             Appears normal
                                          Extremities:

 Other:  Fetus appears to be a male. Heels appears normal.
Cervix Uterus Adnexa

 Cervix:       Normal appearance by transabdominal scan.

 Left Ovary:    Within normal limits.
 Right Ovary:   Not visualized. No adnexal mass visualized.
Comments

 There is an active singleton fetus with no apparent
 dysmorphic features on today's routine anatomic
 examination.
 The biometry suggests a fetus with an EFW at the
 approximately 52nd percentile for gestational age.
Impression

 Active singleton fetus
 Normal anatomic survey.
 Normal interval growth.
 Normal amniotic fluid volume
 CHTN
 history of preeclampsia
Recommendations

 1. Repeat interval growth assessment by ultrasound was
 scheduled for your patient in 4 weeks.
 2. Please see my full MFM consult note
 3. Follow as clinically indicated.

## 2012-12-14 ENCOUNTER — Emergency Department (HOSPITAL_COMMUNITY)
Admission: EM | Admit: 2012-12-14 | Discharge: 2012-12-14 | Disposition: A | Discharge status: Home / Self Care | Payer: Medicaid Other | Attending: Emergency Medicine | Admitting: Emergency Medicine

## 2012-12-14 ENCOUNTER — Emergency Department (HOSPITAL_COMMUNITY): Payer: Medicaid Other

## 2012-12-14 ENCOUNTER — Encounter (HOSPITAL_COMMUNITY): Payer: Self-pay | Admitting: Emergency Medicine

## 2012-12-14 DIAGNOSIS — Z8669 Personal history of other diseases of the nervous system and sense organs: Secondary | ICD-10-CM | POA: Insufficient documentation

## 2012-12-14 DIAGNOSIS — D649 Anemia, unspecified: Secondary | ICD-10-CM | POA: Insufficient documentation

## 2012-12-14 DIAGNOSIS — M25569 Pain in unspecified knee: Secondary | ICD-10-CM | POA: Insufficient documentation

## 2012-12-14 DIAGNOSIS — Z2089 Contact with and (suspected) exposure to other communicable diseases: Secondary | ICD-10-CM | POA: Insufficient documentation

## 2012-12-14 DIAGNOSIS — Z8739 Personal history of other diseases of the musculoskeletal system and connective tissue: Secondary | ICD-10-CM | POA: Insufficient documentation

## 2012-12-14 DIAGNOSIS — Z79899 Other long term (current) drug therapy: Secondary | ICD-10-CM | POA: Insufficient documentation

## 2012-12-14 DIAGNOSIS — W19XXXA Unspecified fall, initial encounter: Secondary | ICD-10-CM

## 2012-12-14 DIAGNOSIS — I10 Essential (primary) hypertension: Secondary | ICD-10-CM | POA: Insufficient documentation

## 2012-12-14 DIAGNOSIS — W010XXA Fall on same level from slipping, tripping and stumbling without subsequent striking against object, initial encounter: Secondary | ICD-10-CM | POA: Insufficient documentation

## 2012-12-14 DIAGNOSIS — F17200 Nicotine dependence, unspecified, uncomplicated: Secondary | ICD-10-CM | POA: Insufficient documentation

## 2012-12-14 DIAGNOSIS — J45909 Unspecified asthma, uncomplicated: Secondary | ICD-10-CM | POA: Insufficient documentation

## 2012-12-14 DIAGNOSIS — Z880 Allergy status to penicillin: Secondary | ICD-10-CM | POA: Insufficient documentation

## 2012-12-14 DIAGNOSIS — Z8742 Personal history of other diseases of the female genital tract: Secondary | ICD-10-CM | POA: Insufficient documentation

## 2012-12-14 DIAGNOSIS — M25562 Pain in left knee: Secondary | ICD-10-CM

## 2012-12-14 DIAGNOSIS — Y93E5 Activity, floor mopping and cleaning: Secondary | ICD-10-CM | POA: Insufficient documentation

## 2012-12-14 DIAGNOSIS — Y92009 Unspecified place in unspecified non-institutional (private) residence as the place of occurrence of the external cause: Secondary | ICD-10-CM | POA: Insufficient documentation

## 2012-12-14 MED ORDER — OXYCODONE-ACETAMINOPHEN 5-325 MG PO TABS: 1.0000 | ORAL_TABLET | ORAL | Status: DC | PRN

## 2012-12-14 NOTE — ED Notes (Signed)
Pt stated that she was mopping the floor and ran across the floor trying to turn pot of boiling water off and slipped and fell. Pt c/o left knee pain. Denies hitting head.

## 2012-12-14 NOTE — ED Provider Notes (Signed)
Medical screening examination/treatment/procedure(s) were performed by non-physician practitioner and as supervising physician I was immediately available for consultation/collaboration.  Roshon Duell, MD 12/14/12 1505 

## 2012-12-14 NOTE — ED Notes (Signed)
Patient transported to X-ray 

## 2012-12-14 NOTE — ED Provider Notes (Signed)
CSN: [id number]     Arrival date & time 12/14/12  4696 History     First MD Initiated Contact with Patient 12/14/12 0915     Chief Complaint  Patient presents with  . Knee Pain  . Fall   (Consider location/radiation/quality/duration/timing/severity/associated sxs/prior Treatment) Patient is a 37 y.o. female presenting with knee pain and fall. The history is provided by the patient and medical records.  Knee Pain Fall Associated symptoms include arthralgias.  Presents to the ED for left knee pain. Patient states she was mopping her kitchen floor yesterday, slipped and fell onto her left knee.  Denies any head trauma or loss of consciousness. Patient states knee was just "sore" yesterday but pain has intensified since, worse with flexion/extension.  Prior left knee injury approximately 15 years ago.  No prior knee surgeries. Patient is not currently established with orthopedics. Denies any numbness or paresthesias of left lower extremity. Pt ambulatory but it is painful.  Has taken OTC motrin without significant relief.  Past Medical History  Diagnosis Date  . Trichimoniasis   . Yeast infection   . Preterm labor 1995  . Anemia     WITH PREGNANCY  . Asthma 08/2011    ALBUTEROL  INHALER PRN  . Infection     UTI X 1  . Headache(784.0)     MIGRAINES RESOLVED  . Arm fracture AGE 47  . Hypertension 2001  . Pregnancy induced hypertension   . Preterm delivery, delivered 04/10/2012   Past Surgical History  Procedure Laterality Date  . Wisdom tooth extraction     Family History  Problem Relation Age of Onset  . Diabetes Mother   . Hypertension Mother   . Drug abuse Mother   . Hypertension Sister   . Heart disease Maternal Grandfather   . Diabetes Maternal Grandfather   . Hypertension Maternal Grandfather   . Diabetes Paternal Aunt   . Cancer Paternal Grandmother   . Stroke Paternal Grandfather   . Hypertension Paternal Grandfather   . Cancer Paternal Grandfather    History    Substance Use Topics  . Smoking status: Current Some Day Smoker -- 0.25 packs/day for 20 years    Types: Cigarettes  . Smokeless tobacco: Never Used  . Alcohol Use: No     Comment: occ prior to pregnancy   OB History   Grav Para Term Preterm Abortions TAB SAB Ect Mult Living   5 5 4 1      4      Review of Systems  Musculoskeletal: Positive for arthralgias.  All other systems reviewed and are negative.    Allergies  Penicillins  Home Medications   Current Outpatient Rx  Name  Route  Sig  Dispense  Refill  . albuterol (PROVENTIL HFA;VENTOLIN HFA) 108 (90 BASE) MCG/ACT inhaler   Inhalation   Inhale 2 puffs into the lungs every 6 (six) hours as needed. Shortness of breath   1 Inhaler   4   . albuterol (PROVENTIL) (2.5 MG/3ML) 0.083% nebulizer solution   Nebulization   Take 2.5 mg by nebulization every 6 (six) hours as needed for wheezing or shortness of breath.          Marland Kitchen ibuprofen (ADVIL,MOTRIN) 200 MG tablet   Oral   Take 400 mg by mouth every 6 (six) hours as needed for pain.         Marland Kitchen NIFEdipine (PROCARDIA-XL/ADALAT CC) 30 MG 24 hr tablet   Oral   Take 1 tablet (30 mg  total) by mouth daily.   30 tablet   2    BP 158/97  Pulse 73  Temp(Src) 98.2 F (36.8 C) (Oral)  Resp 18  SpO2 100%  LMP 11/27/2012  Physical Exam  Nursing note and vitals reviewed. Constitutional: She is oriented to person, place, and time. She appears well-developed and well-nourished.  HENT:  Head: Normocephalic and atraumatic.  Mouth/Throat: Oropharynx is clear and moist.  Eyes: Conjunctivae and EOM are normal. Pupils are equal, round, and reactive to light.  Neck: Normal range of motion.  Cardiovascular: Normal rate, regular rhythm and normal heart sounds.   Pulmonary/Chest: Effort normal and breath sounds normal.  Musculoskeletal:       Left knee: She exhibits decreased range of motion, swelling, ecchymosis and bony tenderness. She exhibits no effusion, no deformity, no  laceration, no erythema, normal alignment and no LCL laxity. Tenderness found.       Legs: Left knee with TTP, swelling, and bruising over patella; no gross deformity; limited flexion/extension due to pain; strong distal pulse, sensation intact, limping gait favoring left leg  Neurological: She is alert and oriented to person, place, and time.  Skin: Skin is warm and dry.  Psychiatric: She has a normal mood and affect.    ED Course   Procedures (including critical care time)  Labs Reviewed - No data to display Dg Knee Complete 4 Views Left  12/14/2012   *RADIOLOGY REPORT*  Clinical Data: Left knee pain following injury.  LEFT KNEE - COMPLETE 4+ VIEW  Comparison: None.  Findings:  No acute fracture or dislocation is noted.  The patella appears somewhat high-riding although no gross soft tissue abnormality is seen.  Soft tissue nodule is noted in the medial left thigh.  IMPRESSION: No acute bony abnormality is noted.   Original Report Authenticated By: Alcide Clever, M.D.   1. Knee pain, acute, left   2. Fall at home, initial encounter     MDM   X-ray negative for acute fracture dislocation. Knee sleeve was applied, but patient states it was too painful to wear. Offered crutches but pt declined stating she cannot use them.  Patient will followup with orthopedics, Dr. Lajoyce Corners, if symptoms not improving within one week. Rx Percocet. Discussed plan with patient, she agreed. Return precautions advised.  Garlon Hatchet, PA-C 12/14/12 1453

## 2013-03-11 ENCOUNTER — Other Ambulatory Visit: Payer: Self-pay

## 2013-05-20 ENCOUNTER — Ambulatory Visit: Payer: Medicaid Other | Admitting: Physician Assistant

## 2013-08-21 ENCOUNTER — Emergency Department (HOSPITAL_COMMUNITY)
Admission: EM | Admit: 2013-08-21 | Discharge: 2013-08-21 | Disposition: A | Discharge status: Home / Self Care | Payer: 59 | Attending: Emergency Medicine | Admitting: Emergency Medicine

## 2013-08-21 ENCOUNTER — Encounter (HOSPITAL_COMMUNITY): Payer: Self-pay | Admitting: Emergency Medicine

## 2013-08-21 DIAGNOSIS — R110 Nausea: Secondary | ICD-10-CM | POA: Insufficient documentation

## 2013-08-21 DIAGNOSIS — I10 Essential (primary) hypertension: Secondary | ICD-10-CM | POA: Insufficient documentation

## 2013-08-21 DIAGNOSIS — Z8619 Personal history of other infectious and parasitic diseases: Secondary | ICD-10-CM | POA: Insufficient documentation

## 2013-08-21 DIAGNOSIS — J45909 Unspecified asthma, uncomplicated: Secondary | ICD-10-CM | POA: Insufficient documentation

## 2013-08-21 DIAGNOSIS — H5500 Unspecified nystagmus: Secondary | ICD-10-CM | POA: Insufficient documentation

## 2013-08-21 DIAGNOSIS — H81399 Other peripheral vertigo, unspecified ear: Secondary | ICD-10-CM | POA: Insufficient documentation

## 2013-08-21 DIAGNOSIS — Z79899 Other long term (current) drug therapy: Secondary | ICD-10-CM | POA: Insufficient documentation

## 2013-08-21 DIAGNOSIS — Z862 Personal history of diseases of the blood and blood-forming organs and certain disorders involving the immune mechanism: Secondary | ICD-10-CM | POA: Insufficient documentation

## 2013-08-21 DIAGNOSIS — Z880 Allergy status to penicillin: Secondary | ICD-10-CM | POA: Insufficient documentation

## 2013-08-21 DIAGNOSIS — Z87440 Personal history of urinary (tract) infections: Secondary | ICD-10-CM | POA: Insufficient documentation

## 2013-08-21 DIAGNOSIS — F17200 Nicotine dependence, unspecified, uncomplicated: Secondary | ICD-10-CM | POA: Insufficient documentation

## 2013-08-21 DIAGNOSIS — Z8781 Personal history of (healed) traumatic fracture: Secondary | ICD-10-CM | POA: Insufficient documentation

## 2013-08-21 MED ORDER — NIFEDIPINE ER OSMOTIC RELEASE 30 MG PO TB24: 30.0000 mg | ORAL_TABLET | Freq: Every day | ORAL | Status: DC

## 2013-08-21 MED ORDER — MECLIZINE HCL 25 MG PO TABS: 25.0000 mg | ORAL_TABLET | Freq: Three times a day (TID) | ORAL | Status: DC | PRN

## 2013-08-21 MED ORDER — MECLIZINE HCL 25 MG PO TABS
25.0000 mg | ORAL_TABLET | Freq: Once | ORAL | Status: AC
  Administered 2013-08-21: 25 mg via ORAL
  Filled 2013-08-21: qty 1

## 2013-08-21 MED ORDER — CLONIDINE HCL 0.1 MG PO TABS
0.2000 mg | ORAL_TABLET | Freq: Once | ORAL | Status: AC
  Administered 2013-08-21: 0.2 mg via ORAL
  Filled 2013-08-21: qty 2

## 2013-08-21 NOTE — Discharge Instructions (Signed)
If you do not already have a primary MD, established care and have blood pressure monitor. Return immediately for worsening symptoms including persistent dizziness, headache, focal weakness, vision changes or any concerns.  Hypertension As your heart beats, it forces blood through your arteries. This force is your blood pressure. If the pressure is too high, it is called hypertension (HTN) or high blood pressure. HTN is dangerous because you may have it and not know it. High blood pressure may mean that your heart has to work harder to pump blood. Your arteries may be narrow or stiff. The extra work puts you at risk for heart disease, stroke, and other problems.  Blood pressure consists of two numbers, a higher number over a lower, 110/72, for example. It is stated as "110 over 72." The ideal is below 120 for the top number (systolic) and under 80 for the bottom (diastolic). Write down your blood pressure today. You should pay close attention to your blood pressure if you have certain conditions such as:  Heart failure.  Prior heart attack.  Diabetes  Chronic kidney disease.  Prior stroke.  Multiple risk factors for heart disease. To see if you have HTN, your blood pressure should be measured while you are seated with your arm held at the level of the heart. It should be measured at least twice. A one-time elevated blood pressure reading (especially in the Emergency Department) does not mean that you need treatment. There may be conditions in which the blood pressure is different between your right and left arms. It is important to see your caregiver soon for a recheck. Most people have essential hypertension which means that there is not a specific cause. This type of high blood pressure may be lowered by changing lifestyle factors such as:  Stress.  Smoking.  Lack of exercise.  Excessive weight.  Drug/tobacco/alcohol use.  Eating less salt. Most people do not have symptoms from high  blood pressure until it has caused damage to the body. Effective treatment can often prevent, delay or reduce that damage. TREATMENT  When a cause has been identified, treatment for high blood pressure is directed at the cause. There are a large number of medications to treat HTN. These fall into several categories, and your caregiver will help you select the medicines that are best for you. Medications may have side effects. You should review side effects with your caregiver. If your blood pressure stays high after you have made lifestyle changes or started on medicines,   Your medication(s) may need to be changed.  Other problems may need to be addressed.  Be certain you understand your prescriptions, and know how and when to take your medicine.  Be sure to follow up with your caregiver within the time frame advised (usually within two weeks) to have your blood pressure rechecked and to review your medications.  If you are taking more than one medicine to lower your blood pressure, make sure you know how and at what times they should be taken. Taking two medicines at the same time can result in blood pressure that is too low. SEEK IMMEDIATE MEDICAL CARE IF:  You develop a severe headache, blurred or changing vision, or confusion.  You have unusual weakness or numbness, or a faint feeling.  You have severe chest or abdominal pain, vomiting, or breathing problems. MAKE SURE YOU:   Understand these instructions.  Will watch your condition.  Will get help right away if you are not doing well or  get worse. Document Released: 04/22/2005 Document Revised: 07/15/2011 Document Reviewed: 12/11/2007 [facility] Patient Information 2014 [place].  Vertigo Vertigo means you feel like you or your surroundings are moving when they are not. Vertigo can be dangerous if it occurs when you are at work, driving, or performing difficult activities.  CAUSES  Vertigo occurs when there is a conflict  of signals sent to your brain from the visual and sensory systems in your body. There are many different causes of vertigo, including:  Infections, especially in the inner ear.  A bad reaction to a drug or misuse of alcohol and medicines.  Withdrawal from drugs or alcohol.  Rapidly changing positions, such as lying down or rolling over in bed.  A migraine headache.  Decreased blood flow to the brain.  Increased pressure in the brain from a head injury, infection, tumor, or bleeding. SYMPTOMS  You may feel as though the world is spinning around or you are falling to the ground. Because your balance is upset, vertigo can cause nausea and vomiting. You may have involuntary eye movements (nystagmus). DIAGNOSIS  Vertigo is usually diagnosed by physical exam. If the cause of your vertigo is unknown, your caregiver may perform imaging tests, such as an MRI scan (magnetic resonance imaging). TREATMENT  Most cases of vertigo resolve on their own, without treatment. Depending on the cause, your caregiver may prescribe certain medicines. If your vertigo is related to body position issues, your caregiver may recommend movements or procedures to correct the problem. In rare cases, if your vertigo is caused by certain inner ear problems, you may need surgery. HOME CARE INSTRUCTIONS   Follow your caregiver's instructions.  Avoid driving.  Avoid operating heavy machinery.  Avoid performing any tasks that would be dangerous to you or others during a vertigo episode.  Tell your caregiver if you notice that certain medicines seem to be causing your vertigo. Some of the medicines used to treat vertigo episodes can actually make them worse in some people. SEEK IMMEDIATE MEDICAL CARE IF:   Your medicines do not relieve your vertigo or are making it worse.  You develop problems with talking, walking, weakness, or using your arms, hands, or legs.  You develop severe headaches.  Your nausea or  vomiting continues or gets worse.  You develop visual changes.  A family member notices behavioral changes.  Your condition gets worse. MAKE SURE YOU:  Understand these instructions.  Will watch your condition.  Will get help right away if you are not doing well or get worse. Document Released: 01/30/2005 Document Revised: 07/15/2011 Document Reviewed: 11/08/2010 [facility] Patient Information 2014 [place].   Emergency Department Resource Guide 1) Find a Doctor and Pay Out of Pocket Although you won't have to find out who is covered by your insurance plan, it is a good idea to ask around and get recommendations. You will then need to call the office and see if the doctor you have chosen will accept you as a new patient and what types of options they offer for patients who are self-pay. Some doctors offer discounts or will set up payment plans for their patients who do not have insurance, but you will need to ask so you aren't surprised when you get to your appointment.  2) Contact Your Local Health Department Not all health departments have doctors that can see patients for sick visits, but many do, so it is worth a call to see if yours does. If you don't know where your  local health department is, you can check in your phone book. The CDC also has a tool to help you locate your state's health department, and many state websites also have listings of all of their local health departments.  3) Find a [facility] If your illness is not likely to be very severe or complicated, you may want to try a walk in clinic. These are popping up all over the country in pharmacies, drugstores, and shopping centers. They're usually staffed by nurse practitioners or physician assistants that have been trained to treat common illnesses and complaints. They're usually fairly quick and inexpensive. However, if you have serious medical issues or chronic medical problems, these are probably not your  best option.  No Primary Care Doctor: - Call Health Connect at  [phone] - they can help you locate a primary care doctor that  accepts your insurance, provides certain services, etc. - Physician Referral Service- [phone]  Chronic Pain Problems: Organization         Address  Phone   Notes  [facility]  [phone] Patients need to be referred by their primary care doctor.   Medication Assistance: Organization         Address  Phone   Notes  [facility] Medication [facility] [address] [place], [place] [address] [phone] --Must be a resident of [facility] -- Must have NO insurance coverage whatsoever (no Medicaid/ Medicare, etc.) -- The pt. MUST have a primary care doctor that directs their care regularly and follows them in the community   MedAssist  [phone]   [employer]  [phone]    Agencies that provide inexpensive medical care: Organization         Address  Phone   Notes  [place]  [phone]   [facility]    [phone]   [facility] [place], [place] [address] [phone]   [place] [address] [place] [phone]   Planned Parenthood    [phone]   [facility]    [phone]   [place] and [place] [address], [place] Phone:  [phone], Fax:  [phone] Hours of Operation:  9 am - 6 pm, M-F.  Also accepts Medicaid/Medicare and self-pay.  [facility] for [place] [address], [address], [place] Phone: [phone], Fax: [phone]. Hours of Operation:  8:30 am - 5:30 pm, M-F.  Also accepts Medicaid and self-pay.  [facility] [place] [address], [place] Phone: [phone]   [place], [place], [place] [phone], Ext. [phone] Mondays & Thursdays: 7-9 AM.  First 15  patients are seen on a first come, first serve basis.    [place] Providers:  Organization         Address  Phone   Notes  [facility] [address], [place] [phone] Also accepts self-pay patients.  [place], [place]  [phone]   [place], [address], [place] [phone]   [facility] [address], [place] [phone]   Lucianne Lei [address], [place]   [phone] Only accepts [place] Access [place] patients after  they have their name applied to their card.   Self-Pay (no insurance) in [facility]:  Organization         Address  Phone   Notes  Sickle Cell Patients, [facility] [place] [phone]   [facility] [place] [phone]   [facility] [place]  [place], [employer], [place] [phone]   [facility]/Dr. Osei-Bonsu  [address] [place] or [address], [place] [phone] Phone number for both [place] and [place] locations is the same.  Urgent Medical and [facility] [address] [place] [phone]   [facility] [address] [place] or [address] [phone]   [facility] [address] [place] [phone], phone; [phone], fax Sees patients 1st and 3rd Saturday of every month.  Must not qualify for public or private insurance (i.e. Medicaid, Medicare, [place] Health Choice, Veterans' Benefits)  Household income should be no more than 200% of the poverty level The clinic cannot treat you if you are pregnant or think you are pregnant  Sexually transmitted diseases are not treated at the clinic.    Dental  Care: Organization         Address  Phone  Notes  [facility] Department of [facility] [place] [phone] Accepts children up to age 56 who are enrolled in [place] or [place]; pregnant women with a Medicaid card; and children who have applied for Medicaid or [place] Health Choice, but were declined, whose parents can pay a reduced fee at time of service.  [facility] Department of [facility]  [address], [place] [phone] Accepts children up to age 58 who are enrolled in [place] or [place]; pregnant women with a Medicaid card; and children who have applied for Medicaid or  Health Choice, but were declined, whose parents can pay a reduced fee at time of service.  [place] Adult Dental Access PROGRAM  [place] [phone] Patients are seen by appointment only. Walk-ins are not accepted. [place] will see patients 21 years of age and older. Monday - Tuesday (8am-5pm) Most Wednesdays (8:30-5pm) $30 per visit, cash only  [facility] Adult Dental Access PROGRAM  [address], [facility] [phone] Patients are seen by appointment only. Walk-ins are not accepted. [place] will see patients 71 years of age and older. One Wednesday Evening (Monthly: Volunteer Based).  $30 per visit, cash only  [place]  [phone] for adults; Children under age 81, call Graduate Pediatric Dentistry at [phone]. Children aged 85-14, please call [phone] to request a pediatric application.  Dental services are provided in all areas of dental care including fillings, crowns and bridges, complete and partial dentures, implants, gum treatment, root canals, and extractions. Preventive care is also provided. Treatment is provided to both adults and children. Patients are selected via a lottery and there is often a waiting list.   [facility] [address], [place]  [phone] [email or web address], [place], [place] [phone], Ext. [phone] Second and Fourth Thursday of each month, opens at 6:30 AM; Clinic ends at 9 AM.  Patients are  seen on a first-come first-served basis, and a limited number are seen during each clinic.   [facility]  [address] Hillard Danker [place], [place] [phone]   Eligibility Requirements You must have lived in [place], [place], or [place] counties for at least the last three months.   You cannot be eligible for state or federal sponsored [employer], including [employer], [place], or [employer].   You generally cannot be eligible for healthcare insurance through your employer.    How to apply: Eligibility screenings are held every Tuesday and Wednesday afternoon from 1:00 pm until 4:00 pm. You do not need an appointment for the interview!  [facility] [address] [place], [place]   [place]  [place] Department  [place]  [phone]    Behavioral Health Resources in the Community: Intensive Outpatient Programs Organization         Address  Phone  Notes  [place]. [address], [place], [place] [phone]   [facility] Outpatient [address] [place], [place]   ADS: Alcohol & Drug Svcs [address] [place], [place]   [place] [address],  [place], [place] or [phone]   Substance Abuse Resources Organization         Address  Phone  Notes  Alcohol and Drug Services  [phone]   [place]  [phone]   [facility] [place]   Chinita Pester  [phone]   Residential & Outpatient Substance Abuse Program  [phone]    Psychological Services Organization         Address  Phone  Notes  [facility]  [place]  [phone]   [place] [address], [place] or [phone]    Mobile Crisis Teams Organization         Address  Phone  Notes  [facility], Mobile Crisis Care Unit  [phone]   [facility]ive Psychotherapeutic Services  [address] [place], [place]   [place] Levels [address] [place] [phone]    Self-Help/Support Groups Organization         Address  Phone             Notes  [place]. of [place] - variety of support groups  [place] Call for more information  Narcotics Anonymous (NA), [facility]aring [facility] [address], [employer] [place]  2 meetings at this location   Special educational needs teacher         Address  Phone  Notes  [facility]AP Residential Treatment [place],    [place]  [phone]   [facility]  [address] [place] [phone], [place], [place]   [place], [place] [phone] Admissions: 8am-3pm M-F  Incentives Substance [place] [address]    [place], [place] [phone]   [facility] [address] Jadene Pierini [place], [place]   [facility] [address]  [place], [place]   Insight Programs - Intensive Outpatient [address], [place], [place]   [facility] [place].,  [place], [place] or [phone]   Residential Treatment Services (RTS) [place],  [place] [id number] Accepts Medicaid  [facility] [place] [address],  [place] [phone] Substance Abuse/Addiction Treatment   [facility] Organization         Address  Phone  Notes  [facility]  [phone]   Domenic Schwab, PhD [address] Arlis Porta [place], [place]   [phone] or [phone]   [place], [place] [phone]   [address], [place], [place] [phone] Insurance/Medicaid/sponsorship through [facility] and Families [address] [place]                                    [place], [place] [phone] [place] [address] [place], [place] [phone]    Dr. Adele Schilder  [phone]   [facility] Dept. 1) [address] [place] 2) [place] 3)  [place] [address], [place] [phone]  [phone]   [place] [phone] or [phone] (After Hours)

## 2013-08-21 NOTE — ED Notes (Signed)
MD at bedside. 

## 2013-08-21 NOTE — ED Provider Notes (Signed)
CSN: [id number]     Arrival date & time 08/21/13  0315 History   First MD Initiated Contact with Patient 08/21/13 [phone]     Chief Complaint  Patient presents with  . Hypertension     (Consider location/radiation/quality/duration/timing/severity/associated sxs/prior Treatment) HPI Patient has a history of hypertension states she's been out of her blood pressure medication for the past 2 weeks. This evening she rolled over in bed and had acute onset of dizziness described as room spinning. Had mild nausea associated with it. She suspected her blood pressure was elevated and came to the emergency department. She's been in the room the patient states her dizziness and nausea has improved. She specifically denied any chest pain or shortness of breath. Patient has no vision changes. Patient has no focal weakness or numbness. She denies any hearing changes. Patient does not have a headache. Past Medical History  Diagnosis Date  . Trichimoniasis   . Yeast infection   . Preterm labor 1995  . Anemia     WITH PREGNANCY  . Asthma 08/2011    ALBUTEROL  INHALER PRN  . Infection     UTI X 1  . Headache(784.0)     MIGRAINES RESOLVED  . Arm fracture AGE 34  . Hypertension 2001  . Pregnancy induced hypertension   . Preterm delivery, delivered 04/10/2012   Past Surgical History  Procedure Laterality Date  . Wisdom tooth extraction     Family History  Problem Relation Age of Onset  . Diabetes Mother   . Hypertension Mother   . Drug abuse Mother   . Hypertension Sister   . Heart disease Maternal Grandfather   . Diabetes Maternal Grandfather   . Hypertension Maternal Grandfather   . Diabetes Paternal Aunt   . Cancer Paternal Grandmother   . Stroke Paternal Grandfather   . Hypertension Paternal Grandfather   . Cancer Paternal Grandfather    History  Substance Use Topics  . Smoking status: Current Some Day Smoker -- 0.25 packs/day for 20 years    Types: Cigarettes  . Smokeless tobacco: Never  Used  . Alcohol Use: No     Comment: occ   OB History   Grav Para Term Preterm Abortions TAB SAB Ect Mult Living   5 5 4 1      4      Review of Systems  Constitutional: Negative for fever and chills.  HENT: Negative for ear pain and hearing loss.   Eyes: Negative for visual disturbance.  Respiratory: Negative for shortness of breath.   Cardiovascular: Negative for chest pain.  Gastrointestinal: Positive for nausea. Negative for vomiting and abdominal pain.  Genitourinary: Negative for dysuria.  Musculoskeletal: Negative for back pain, myalgias, neck pain and neck stiffness.  Skin: Negative for rash and wound.  Neurological: Positive for dizziness and light-headedness. Negative for syncope, weakness, numbness and headaches.  All other systems reviewed and are negative.     Allergies  Penicillins  Home Medications   Prior to Admission medications   Medication Sig Start Date End Date Taking? Authorizing Provider  albuterol (PROVENTIL) (2.5 MG/3ML) 0.083% nebulizer solution Take 2.5 mg by nebulization every 6 (six) hours as needed for wheezing or shortness of breath.    Yes Historical Provider, MD  ibuprofen (ADVIL,MOTRIN) 200 MG tablet Take 400 mg by mouth every 6 (six) hours as needed for pain.   Yes Historical Provider, MD  albuterol (PROVENTIL HFA;VENTOLIN HFA) 108 (90 BASE) MCG/ACT inhaler Inhale 2 puffs into the lungs every  6 (six) hours as needed. Shortness of breath 02/04/12   Donnel Saxon, CNM   BP 186/116  Pulse 82  Temp(Src) 98.1 F (36.7 C) (Oral)  Resp 15  Ht 5\' 7"  (1.702 m)  Wt 185 lb (83.915 kg)  BMI 28.97 kg/m2  SpO2 100%  LMP 08/08/2013  Breastfeeding? No Physical Exam  Nursing note and vitals reviewed. Constitutional: She is oriented to person, place, and time. She appears well-developed and well-nourished. No distress.  HENT:  Head: Normocephalic and atraumatic.  Mouth/Throat: Oropharynx is clear and moist. No oropharyngeal exudate.  Eyes: EOM are  normal. Pupils are equal, round, and reactive to light.  Fatigable rotary nystagmus.  Neck: Normal range of motion. Neck supple.  Cardiovascular: Normal rate and regular rhythm.   Pulmonary/Chest: Effort normal and breath sounds normal. No respiratory distress. She has no wheezes. She has no rales. She exhibits no tenderness.  Abdominal: Soft. Bowel sounds are normal. She exhibits no distension and no mass. There is no tenderness. There is no rebound and no guarding.  Musculoskeletal: Normal range of motion. She exhibits no edema and no tenderness.  Neurological: She is alert and oriented to person, place, and time.  Patient is alert and oriented x3 with clear, goal oriented speech. Patient has 5/5 motor in all extremities. Sensation is intact to light touch. Bilateral finger-to-nose is normal with no signs of dysmetria. Patient has a normal gait and walks without assistance.  Vertigo induced with rapid elevation of the head of bed.   Skin: Skin is warm and dry. No rash noted. No erythema.  Psychiatric: She has a normal mood and affect. Her behavior is normal.    ED Course  Procedures (including critical care time) Labs Review Labs Reviewed - No data to display  Imaging Review No results found.   EKG Interpretation None      MDM   Final diagnoses:  None    Patient states her dizziness has improved. Her blood pressures normalized. Her neurologic exam remains normal. Restart her nifedipine and have followup with her primary Dr. Return precautions given.  Julianne Rice, MD 08/21/13 [phone]

## 2013-08-21 NOTE — ED Notes (Addendum)
Pt c/o HTN, pt states she rolled over a few hours ago and noted dizziness. Pt states she has not had her medication for hypertension x 2 weeks.

## 2014-02-21 ENCOUNTER — Encounter (HOSPITAL_COMMUNITY): Payer: Self-pay | Admitting: Emergency Medicine

## 2014-02-21 ENCOUNTER — Emergency Department (INDEPENDENT_AMBULATORY_CARE_PROVIDER_SITE_OTHER)
Admission: EM | Admit: 2014-02-21 | Discharge: 2014-02-21 | Disposition: A | Discharge status: Home / Self Care | Payer: 59 | Source: Home / Self Care | Attending: Family Medicine | Admitting: Family Medicine

## 2014-02-21 DIAGNOSIS — I10 Essential (primary) hypertension: Secondary | ICD-10-CM

## 2014-02-21 DIAGNOSIS — I889 Nonspecific lymphadenitis, unspecified: Secondary | ICD-10-CM

## 2014-02-21 LAB — POCT I-STAT, CHEM 8
BUN: 8 mg/dL (ref 6–23)
CHLORIDE: 106 meq/L (ref 96–112)
Calcium, Ion: 1.18 mmol/L (ref 1.12–1.23)
Creatinine, Ser: 0.7 mg/dL (ref 0.50–1.10)
Glucose, Bld: 102 mg/dL — ABNORMAL HIGH (ref 70–99)
HCT: 48 % — ABNORMAL HIGH (ref 36.0–46.0)
Hemoglobin: 16.3 g/dL — ABNORMAL HIGH (ref 12.0–15.0)
POTASSIUM: 4 meq/L (ref 3.7–5.3)
SODIUM: 140 meq/L (ref 137–147)
TCO2: 25 mmol/L (ref 0–100)

## 2014-02-21 MED ORDER — HYDROCHLOROTHIAZIDE 25 MG PO TABS: 25.0000 mg | ORAL_TABLET | Freq: Every day | ORAL | Status: DC

## 2014-02-21 MED ORDER — CLINDAMYCIN HCL 300 MG PO CAPS: 300.0000 mg | ORAL_CAPSULE | Freq: Three times a day (TID) | ORAL | Status: AC

## 2014-02-21 NOTE — ED Provider Notes (Signed)
Danielle [name] is a 38 y.o. female who presents to Urgent Care today for neck pain. Patient will this morning with significant pain at the angle of her left jaw. She notes tenderness just inferior to her left ear extending along the angle of her jaw for several centimeters. She denies any pain with jaw motion or neck motion. She denies any fevers or chills nausea vomiting or diarrhea. She has not tried any medications yet.  Additionally patient notes that she has run out of her blood pressure medicine. She was taking labetalol daily for blood pressure control following pregnancy-induced hypertension. She's been taking his medicine for a year postpartum. She denies any chest pain palpitations or shortness of breath.   Past Medical History  Diagnosis Date  . Trichimoniasis   . Yeast infection   . Preterm labor 1995  . Anemia     WITH PREGNANCY  . Asthma 08/2011    ALBUTEROL  INHALER PRN  . Infection     UTI X 1  . Headache(784.0)     MIGRAINES RESOLVED  . Arm fracture AGE 26  . Hypertension 2001  . Pregnancy induced hypertension   . Preterm delivery, delivered 04/10/2012   History  Substance Use Topics  . Smoking status: Current Some Day Smoker -- 0.25 packs/day for 20 years    Types: Cigarettes  . Smokeless tobacco: Never Used  . Alcohol Use: No     Comment: occ   ROS as above Medications: No current facility-administered medications for this encounter.   Current Outpatient Prescriptions  Medication Sig Dispense Refill  . albuterol (PROVENTIL HFA;VENTOLIN HFA) 108 (90 BASE) MCG/ACT inhaler Inhale 2 puffs into the lungs every 6 (six) hours as needed. Shortness of breath  1 Inhaler  4  . clindamycin (CLEOCIN) 300 MG capsule Take 1 capsule (300 mg total) by mouth 3 (three) times daily.  30 capsule  0  . hydrochlorothiazide (HYDRODIURIL) 25 MG tablet Take 1 tablet (25 mg total) by mouth daily.  30 tablet  2    Exam:  BP 172/105  Pulse 67  Temp(Src) 98 F (36.7 C) (Oral)   SpO2 97%  LMP 01/23/2014 Gen: Well NAD HEENT: EOMI,  MMM normal tympanic membranes and posterior pharynx. Mastoids are nontender bilaterally. Patient has tenderness to palpation but no masses just inferior to the left ear and along the angle of the jaw. The trapezius and cervical paraspinals are nontender Lungs: Normal work of breathing. CTABL Heart: RRR no MRG Abd: NABS, Soft. Nondistended, Nontender Exts: Brisk capillary refill, warm and well perfused.  Neck: Nontender spinal midline. Normal neck range of motion. Upper extremity strength and reflexes are equal and normal bilaterally. Sensation is intact throughout  Results for orders placed during the hospital encounter of 02/21/14 (from the past 24 hour(s))  POCT I-STAT, CHEM 8     Status: Abnormal   Collection Time    02/21/14 11:56 AM      Result Value Ref Range   Sodium 140  137 - 147 mEq/L   Potassium 4.0  3.7 - 5.3 mEq/L   Chloride 106  96 - 112 mEq/L   BUN 8  6 - 23 mg/dL   Creatinine, Ser 0.70  0.50 - 1.10 mg/dL   Glucose, Bld 102 (*) 70 - 99 mg/dL   Calcium, Ion 1.18  1.12 - 1.23 mmol/L   TCO2 25  0 - 100 mmol/L   Hemoglobin 16.3 (*) 12.0 - 15.0 g/dL   HCT 48.0 (*) 36.0 -  46.0 %   No results found.  Assessment and Plan: 38 y.o. female with  1) cervical lymphadenitis: Chewable Tylenol and clindamycin antibiotics. Prompt followup if not improving and present to the emergency room with worsening. 2) hypertension: Start hydrochlorothiazide today. Followup with PCP.  Discussed warning signs or symptoms. Please see discharge instructions. Patient expresses understanding.     Gregor Hams, MD 02/21/14 [phone]

## 2014-02-21 NOTE — Discharge Instructions (Signed)
Thank you for coming in today. Take clindamycin 3 times daily for 10 days for neck pain. Take Tylenol or ibuprofen as needed for pain. Start taking hydrochlorothiazide for blood pressure. Followup with primary care provider Call or go to the emergency room if you get worse, have trouble breathing, have chest pains, or palpitations.    Cervical Adenitis You have a swollen lymph gland in your neck. This commonly happens with Strep and virus infections, dental problems, insect bites, and injuries about the face, scalp, or neck. The lymph glands swell as the body fights the infection or heals the injury. Swelling and firmness typically lasts for several weeks after the infection or injury is healed. Rarely lymph glands can become swollen because of cancer or TB. Antibiotics are prescribed if there is evidence of an infection. Sometimes an infected lymph gland becomes filled with pus. This condition may require opening up the abscessed gland by draining it surgically. Most of the time infected glands return to normal within two weeks. Do not poke or squeeze the swollen lymph nodes. That may keep them from shrinking back to their normal size. If the lymph gland is still swollen after 2 weeks, further medical evaluation is needed.  SEEK IMMEDIATE MEDICAL CARE IF:  You have difficulty swallowing or breathing, increased swelling, severe pain, or a high fever.  Document Released: 04/22/2005 Document Revised: 07/15/2011 Document Reviewed: 10/12/2006 [facility] Patient Information 2015 [place], [place]. This information is not intended to replace advice given to you by your health care provider. Make sure you discuss any questions you have with your health care provider.   Hypertension Hypertension, commonly called high blood pressure, is when the force of blood pumping through your arteries is too strong. Your arteries are the blood vessels that carry blood from your heart throughout your body. A blood pressure  reading consists of a higher number over a lower number, such as 110/72. The higher number (systolic) is the pressure inside your arteries when your heart pumps. The lower number (diastolic) is the pressure inside your arteries when your heart relaxes. Ideally you want your blood pressure below 120/80. Hypertension forces your heart to work harder to pump blood. Your arteries may become narrow or stiff. Having hypertension puts you at risk for heart disease, stroke, and other problems.  RISK FACTORS Some risk factors for high blood pressure are controllable. Others are not.  Risk factors you cannot control include:   Race. You may be at higher risk if you are African American.  Age. Risk increases with age.  Gender. Men are at higher risk than women before age 61 years. After age 101, women are at higher risk than men. Risk factors you can control include:  Not getting enough exercise or physical activity.  Being overweight.  Getting too much fat, sugar, calories, or salt in your diet.  Drinking too much alcohol. SIGNS AND SYMPTOMS Hypertension does not usually cause signs or symptoms. Extremely high blood pressure (hypertensive crisis) may cause headache, anxiety, shortness of breath, and nosebleed. DIAGNOSIS  To check if you have hypertension, your health care provider will measure your blood pressure while you are seated, with your arm held at the level of your heart. It should be measured at least twice using the same arm. Certain conditions can cause a difference in blood pressure between your right and left arms. A blood pressure reading that is higher than normal on one occasion does not mean that you need treatment. If one blood pressure reading  is high, ask your health care provider about having it checked again. TREATMENT  Treating high blood pressure includes making lifestyle changes and possibly taking medicine. Living a healthy lifestyle can help lower high blood pressure. You  may need to change some of your habits. Lifestyle changes may include:  Following the DASH diet. This diet is high in fruits, vegetables, and whole grains. It is low in salt, red meat, and added sugars.  Getting at least 2 hours of brisk physical activity every week.  Losing weight if necessary.  Not smoking.  Limiting alcoholic beverages.  Learning ways to reduce stress. If lifestyle changes are not enough to get your blood pressure under control, your health care provider may prescribe medicine. You may need to take more than one. Work closely with your health care provider to understand the risks and benefits. HOME CARE INSTRUCTIONS  Have your blood pressure rechecked as directed by your health care provider.   Take medicines only as directed by your health care provider. Follow the directions carefully. Blood pressure medicines must be taken as prescribed. The medicine does not work as well when you skip doses. Skipping doses also puts you at risk for problems.   Do not smoke.   Monitor your blood pressure at home as directed by your health care provider. SEEK MEDICAL CARE IF:   You think you are having a reaction to medicines taken.  You have recurrent headaches or feel dizzy.  You have swelling in your ankles.  You have trouble with your vision. SEEK IMMEDIATE MEDICAL CARE IF:  You develop a severe headache or confusion.  You have unusual weakness, numbness, or feel faint.  You have severe chest or abdominal pain.  You vomit repeatedly.  You have trouble breathing. MAKE SURE YOU:   Understand these instructions.  Will watch your condition.  Will get help right away if you are not doing well or get worse. Document Released: 04/22/2005 Document Revised: 09/06/2013 Document Reviewed: 02/12/2013 [facility] Patient Information 2015 [place], [place]. This information is not intended to replace advice given to you by your health care provider. Make sure you  discuss any questions you have with your health care provider.   PRIMARY CARE Paramedic at [place], [place] Ph [phone]  Fax [phone]  Therapist, music at [facility] [address] [place], [place] Ph [phone]  Fax [phone]  Therapist, music at [place] [facility] [place] [phone] [address] [place], [place] Ph [phone]  Fax [phone]  [facility] at [facility] [address], [place]  [place], [place] Ph [phone]  Fax [phone]  [place] [address] [place] [address] [place], [place] Ph [phone]  Fax [phone]  [facility] at [facility] [place], [place] Ph [phone]  Fax [phone]   [place] @ [place] [address] Phone: [phone]   [place] @ [facility] [place]. [place] [address] Phone: [place] @ [address] [place] [address] Phone: [place] @ [place]. [place] [address] Phone: [place] @ [place]. [employer] [place], [place] [address] Phone: [phone] Fax: [phone]  [place] @ [place] [address] Phone: [phone]   Dr. Rachell Cipro [address] [place] [address] [phone]

## 2014-02-21 NOTE — ED Notes (Signed)
Pt is here today for pain and swelling on the left side of her neck, pt said that the pain just started this morning

## 2014-03-07 ENCOUNTER — Encounter (HOSPITAL_COMMUNITY): Payer: Self-pay | Admitting: Emergency Medicine

## 2015-04-27 ENCOUNTER — Emergency Department (HOSPITAL_COMMUNITY)
Admission: EM | Admit: 2015-04-27 | Discharge: 2015-04-27 | Disposition: A | Discharge status: Home / Self Care | Payer: Commercial Managed Care - HMO | Attending: Emergency Medicine | Admitting: Emergency Medicine

## 2015-04-27 ENCOUNTER — Encounter (HOSPITAL_COMMUNITY): Payer: Self-pay | Admitting: Emergency Medicine

## 2015-04-27 DIAGNOSIS — Z87828 Personal history of other (healed) physical injury and trauma: Secondary | ICD-10-CM | POA: Diagnosis not present

## 2015-04-27 DIAGNOSIS — R61 Generalized hyperhidrosis: Secondary | ICD-10-CM | POA: Diagnosis not present

## 2015-04-27 DIAGNOSIS — M791 Myalgia: Secondary | ICD-10-CM | POA: Insufficient documentation

## 2015-04-27 DIAGNOSIS — J45909 Unspecified asthma, uncomplicated: Secondary | ICD-10-CM | POA: Diagnosis not present

## 2015-04-27 DIAGNOSIS — Z87440 Personal history of urinary (tract) infections: Secondary | ICD-10-CM | POA: Diagnosis not present

## 2015-04-27 DIAGNOSIS — Z792 Long term (current) use of antibiotics: Secondary | ICD-10-CM | POA: Insufficient documentation

## 2015-04-27 DIAGNOSIS — Z862 Personal history of diseases of the blood and blood-forming organs and certain disorders involving the immune mechanism: Secondary | ICD-10-CM | POA: Insufficient documentation

## 2015-04-27 DIAGNOSIS — L02412 Cutaneous abscess of left axilla: Secondary | ICD-10-CM | POA: Insufficient documentation

## 2015-04-27 DIAGNOSIS — Z79899 Other long term (current) drug therapy: Secondary | ICD-10-CM | POA: Diagnosis not present

## 2015-04-27 DIAGNOSIS — Z8619 Personal history of other infectious and parasitic diseases: Secondary | ICD-10-CM | POA: Insufficient documentation

## 2015-04-27 DIAGNOSIS — I10 Essential (primary) hypertension: Secondary | ICD-10-CM | POA: Diagnosis not present

## 2015-04-27 DIAGNOSIS — F17210 Nicotine dependence, cigarettes, uncomplicated: Secondary | ICD-10-CM | POA: Insufficient documentation

## 2015-04-27 DIAGNOSIS — Z8751 Personal history of pre-term labor: Secondary | ICD-10-CM | POA: Insufficient documentation

## 2015-04-27 DIAGNOSIS — Z880 Allergy status to penicillin: Secondary | ICD-10-CM | POA: Diagnosis not present

## 2015-04-27 MED ORDER — OXYCODONE-ACETAMINOPHEN 5-325 MG PO TABS
1.0000 | ORAL_TABLET | Freq: Once | ORAL | Status: AC
  Administered 2015-04-27: 1 via ORAL
  Filled 2015-04-27: qty 1

## 2015-04-27 MED ORDER — SULFAMETHOXAZOLE-TRIMETHOPRIM 800-160 MG PO TABS
1.0000 | ORAL_TABLET | Freq: Once | ORAL | Status: AC
  Administered 2015-04-27: 1 via ORAL
  Filled 2015-04-27: qty 1

## 2015-04-27 MED ORDER — SULFAMETHOXAZOLE-TRIMETHOPRIM 800-160 MG PO TABS: 1.0000 | ORAL_TABLET | Freq: Two times a day (BID) | ORAL | Status: AC

## 2015-04-27 MED ORDER — LIDOCAINE-EPINEPHRINE (PF) 2 %-1:200000 IJ SOLN
10.0000 mL | Freq: Once | INTRAMUSCULAR | Status: AC
  Administered 2015-04-27: 10 mL
  Filled 2015-04-27: qty 20

## 2015-04-27 NOTE — ED Notes (Signed)
Patient states boil under L arm x 1 week.   Patient states has had them previously. Denies other symptoms.

## 2015-04-27 NOTE — ED Provider Notes (Signed)
CSN: [id number]     Arrival date & time 04/27/15  0911 History  By signing my name below, I, Rayna Sexton, attest that this documentation has been prepared under the direction and in the presence of [employer], PA-C. Electronically Signed: Rayna Sexton, ED Scribe. 04/27/2015. 10:20 AM.   Chief Complaint  Patient presents with  . Abscess   The history is provided by the patient. No language interpreter was used.    HPI Comments: Danielle [name] is a 39 y.o. female who presents to the Emergency Department complaining of a point of worsening swelling and irritation to her left axilla with onset 1 week ago. Pt notes a hx of abscesses further noting a hx of I&D's which she says provided relief of her symptoms. She notes applying warm compresses and taking 2x Motrin and denies relief with either. Pt notes associated night sweats. Pt notes a hx of smoking 1/2 PPD. She confirms her tdap is UTD. Pt denies fevers, chills or any other associated symptoms at this time.   Past Medical History  Diagnosis Date  . Trichimoniasis   . Yeast infection   . Preterm labor 1995  . Anemia     WITH PREGNANCY  . Asthma 08/2011    ALBUTEROL  INHALER PRN  . Infection     UTI X 1  . Headache(784.0)     MIGRAINES RESOLVED  . Arm fracture AGE 36  . Hypertension 2001  . Pregnancy induced hypertension   . Preterm delivery, delivered 04/10/2012   Past Surgical History  Procedure Laterality Date  . Wisdom tooth extraction     Family History  Problem Relation Age of Onset  . Diabetes Mother   . Hypertension Mother   . Drug abuse Mother   . Hypertension Sister   . Heart disease Maternal Grandfather   . Diabetes Maternal Grandfather   . Hypertension Maternal Grandfather   . Diabetes Paternal Aunt   . Cancer Paternal Grandmother   . Stroke Paternal Grandfather   . Hypertension Paternal Grandfather   . Cancer Paternal Grandfather    Social History  Substance Use Topics  . Smoking status:  Current Every Day Smoker -- 0.50 packs/day for 20 years    Types: Cigarettes  . Smokeless tobacco: Never Used  . Alcohol Use: No     Comment: occ   OB History    Gravida Para Term Preterm AB TAB SAB Ectopic Multiple Living   5 5 4 1      4      Review of Systems  Constitutional: Positive for diaphoresis. Negative for fever and chills.  Musculoskeletal: Positive for myalgias.  Skin: Positive for color change.   Allergies  Penicillins  Home Medications   Prior to Admission medications   Medication Sig Start Date End Date Taking? Authorizing Provider  hydrochlorothiazide (HYDRODIURIL) 25 MG tablet Take 1 tablet (25 mg total) by mouth daily. 02/21/14  Yes Gregor Hams, MD  albuterol (PROVENTIL HFA;VENTOLIN HFA) 108 (90 BASE) MCG/ACT inhaler Inhale 2 puffs into the lungs every 6 (six) hours as needed. Shortness of breath 02/04/12   Donnel Saxon, CNM  clindamycin (CLEOCIN) 300 MG capsule Take 1 capsule (300 mg total) by mouth 3 (three) times daily. 02/21/14   Gregor Hams, MD  sulfamethoxazole-trimethoprim (BACTRIM DS,SEPTRA DS) 800-160 MG tablet Take 1 tablet by mouth 2 (two) times daily. 04/27/15 05/04/15  [place] Tozer Patel-Mills, PA-C   BP 140/104 mmHg  Pulse 82  Temp(Src) 98.4 F (36.9 C) (Oral)  Resp 16  Ht 5\' 7"  (1.702 m)  Wt 79.379 kg  BMI 27.40 kg/m2  SpO2 95%  LMP 04/26/2015 Physical Exam  Constitutional: She is oriented to person, place, and time. She appears well-developed and well-nourished.  HENT:  Head: Normocephalic and atraumatic.  Mouth/Throat: No oropharyngeal exudate.  Neck: Normal range of motion. No tracheal deviation present.  Cardiovascular: Normal rate.   Pulmonary/Chest: Effort normal. No respiratory distress.  Abdominal: Soft.  Musculoskeletal: Normal range of motion.  Neurological: She is alert and oriented to person, place, and time.  Skin: Skin is warm and dry. She is not diaphoretic.  Area of fluctuance to her left axiliary region with tenderness and  warmth; I&D performed with 60cc of purulent drainage  Psychiatric: She has a normal mood and affect. Her behavior is normal.  Nursing note and vitals reviewed.  ED Course  Procedures  DIAGNOSTIC STUDIES: Oxygen Saturation is 97% on RA, normal by my interpretation.    COORDINATION OF CARE: 9:36 AM Pt presents today due to an abscess in her left axiliary region. Discussed next steps with pt including an I&D and pt agreed to the plan.   INCISION AND DRAINAGE PROCEDURE NOTE: Patient identification was confirmed and verbal consent was obtained. This procedure was performed by Ottie Glazier, PA-C at 10:07 AM. Site: left axiliary region Sterile procedures observed Needle size: 25 gauge Anesthetic used (type and amt): 2% lidocaine w/epi; 2 mL Blade size: 11 Drainage: 60 cc of purulent drainage Complexity: Complex 1/2 inch packing used Site anesthetized, incision made over site, wound drained and explored loculations, rinsed with copious amounts of normal saline, wound packed with sterile gauze, covered with dry, sterile dressing.  Pt tolerated procedure well without complications.  Instructions for care discussed verbally and pt provided with additional written instructions for homecare and f/u.  Labs Review Labs Reviewed - No data to display  Imaging Review No results found.   EKG Interpretation None      MDM   Final diagnoses:  Abscess of left axilla  Patient presents for left axillary abscess. She reports having these in the past with I&D. She had copious amounts of thin greenish and yellowish discharge from the abscess. She is afebrile and well-appearing. She was put on Bactrim because of the depth of the abscess and surrounding induration. After I and D she immediately started feeling better and was able to move her left arm. I discussed return precautions with the patient as well as removing the packing following night. I also explained that she should continue warm  compresses. She agrees with the plan.  I personally performed the services described in this documentation, which was scribed in my presence. The recorded information has been reviewed and is accurate.     Ottie Glazier, PA-C 04/27/15 [place], MD 04/27/15 1351

## 2015-04-27 NOTE — Discharge Instructions (Signed)
Incision and Drainage Follow-up with a primary care physician using the resource guide below. Take Bactrim as prescribed. Apply warm compresses to the area. Remove packing tomorrow night. Incision and drainage is a procedure in which a sac-like structure (cystic structure) is opened and drained. The area to be drained usually contains material such as pus, fluid, or blood.  LET YOUR CAREGIVER KNOW ABOUT:   Allergies to medicine.  Medicines taken, including vitamins, herbs, eyedrops, over-the-counter medicines, and creams.  Use of steroids (by mouth or creams).  Previous problems with anesthetics or numbing medicines.  History of bleeding problems or blood clots.  Previous surgery.  Other health problems, including diabetes and kidney problems.  Possibility of pregnancy, if this applies. RISKS AND COMPLICATIONS  Pain.  Bleeding.  Scarring.  Infection. BEFORE THE PROCEDURE  You may need to have an ultrasound or other imaging tests to see how large or deep your cystic structure is. Blood tests may also be used to determine if you have an infection or how severe the infection is. You may need to have a tetanus shot. PROCEDURE  The affected area is cleaned with a cleaning fluid. The cyst area will then be numbed with a medicine (local anesthetic). A small incision will be made in the cystic structure. A syringe or catheter may be used to drain the contents of the cystic structure, or the contents may be squeezed out. The area will then be flushed with a cleansing solution. After cleansing the area, it is often gently packed with a gauze or another wound dressing. Once it is packed, it will be covered with gauze and tape or some other type of wound dressing. AFTER THE PROCEDURE   Often, you will be allowed to go home right after the procedure.  You may be given antibiotic medicine to prevent or heal an infection.  If the area was packed with gauze or some other wound dressing, you  will likely need to come back in 1 to 2 days to get it removed.  The area should heal in about 14 days.   This information is not intended to replace advice given to you by your health care provider. Make sure you discuss any questions you have with your health care provider.   Document Released: 10/16/2000 Document Revised: 10/22/2011 Document Reviewed: 06/17/2011 Elsevier Interactive Patient Education [employer].  Emergency Department Resource Guide 1) Find a Doctor and Pay Out of Pocket Although you won't have to find out who is covered by your insurance plan, it is a good idea to ask around and get recommendations. You will then need to call the office and see if the doctor you have chosen will accept you as a new patient and what types of options they offer for patients who are self-pay. Some doctors offer discounts or will set up payment plans for their patients who do not have insurance, but you will need to ask so you aren't surprised when you get to your appointment.  2) Contact Your Local Health Department Not all health departments have doctors that can see patients for sick visits, but many do, so it is worth a call to see if yours does. If you don't know where your local health department is, you can check in your phone book. The CDC also has a tool to help you locate your state's health department, and many state websites also have listings of all of their local health departments.  3) Find a [employer] If your  illness is not likely to be very severe or complicated, you may want to try a walk in clinic. These are popping up all over the country in pharmacies, drugstores, and shopping centers. They're usually staffed by nurse practitioners or physician assistants that have been trained to treat common illnesses and complaints. They're usually fairly quick and inexpensive. However, if you have serious medical issues or chronic medical problems, these are probably not your  best option.  No Primary Care Doctor: - Call Health Connect at  [phone] - they can help you locate a primary care doctor that  accepts your insurance, provides certain services, etc. - Physician Referral Service- [phone]  Chronic Pain Problems: Organization         Address  Phone   Notes  [facility]  [phone] Patients need to be referred by their primary care doctor.   Medication Assistance: Organization         Address  Phone   Notes  [facility] Medication [facility] [address] [place], [place] [address] [phone] --Must be a resident of [facility] -- Must have NO insurance coverage whatsoever (no Medicaid/ Medicare, etc.) -- The pt. MUST have a primary care doctor that directs their care regularly and follows them in the community   Med[facility]ist  [phone]   [employer]  [phone]    Agencies that provide inexpensive medical care: Organization         Address  Phone   Notes  [place]  [phone]   [facility]    [phone]   [facility] [place], [place] [address] [phone]   [place] [address] [place] [phone]   Planned Parenthood    [phone]   [facility]    [phone]   [place] and [place] [address], [place] Phone:  [phone], Fax:  [phone] Hours of Operation:  9 am - 6 pm, M-F.  Also accepts Medicaid/Medicare and self-pay.  [facility] for [address], [address], [place] Phone: [phone], Fax: [phone]. Hours of Operation:  8:30 am - 5:30 pm, M-F.  Also accepts Medicaid and self-pay.  [facility] [place] [address] [place] Phone: [phone]   [place], [place], [place] [phone], Ext. [phone] Mondays & Thursdays: 7-9 AM.  First 15  patients are seen on a first come, first serve basis.    [place] Providers:  Organization         Address  Phone   Notes  [facility] [address], [place] [phone] Also accepts self-pay patients.  [facility] [id number] [place], [place]  [phone]   [place], [address], [place] [phone]   [facility] [address] [place] [phone]   Lucianne Lei [address], [place]   [phone] Only accepts [place] Access [place] patients after they have their name applied to their card.   Self-Pay (no insurance) in [facility]:  Organization         Address  Phone   Notes  Sickle Cell Patients, [facility] [place], [place] [phone]  [facility] [place] [phone]   [facility] [place]  [place] [place], [address], [place] [phone]   [facility]/Dr. Osei-Bonsu  [address] [place] or [address], [place] [phone] Phone number for both [place] and [place] locations is the same.  [facility]gent [facility] and [facility] [address], [place] [phone]   [facility] [address] [place] or [address] [phone]   [facility] [address], [place] [phone], phone; [phone], fax Sees patients 1st and 3rd Saturday of every month.  Must not qualify for public or private insurance (i.e. Medicaid, Medicare, [place] Health Choice, Veterans' Benefits)  Household income should be no more than 200% of the poverty level The clinic cannot treat you if you are pregnant or think you are pregnant  Sexually transmitted diseases are not treated at the clinic.    Dental  Care: Organization         Address  Phone  Notes  [facility] Department of [facility] [phone] Accepts children up to age 63 who are enrolled in [place] or [place]; pregnant women with a Medicaid card; and children who have applied for Medicaid or [place] Health Choice, but were declined, whose parents can pay a reduced fee at time of service.  [facility] Department of [facility]  [address], [place] [phone] Accepts children up to age 5 who are enrolled in [place] or [place]; pregnant women with a Medicaid card; and children who have applied for Medicaid or [place] Health Choice, but were declined, whose parents can pay a reduced fee at time of service.  [place] Adult Dental Access PROGRAM  [place] [phone] Patients are seen by appointment only. Walk-ins are not accepted. [place] will see patients 20 years of age and older. Monday - Tuesday (8am-5pm) Most Wednesdays (8:30-5pm) $30 per visit, cash only  [facility] Adult Dental Access PROGRAM  [address], [facility] [phone] Patients are seen by appointment only. Walk-ins are not accepted. [place] will see patients 40 years of age and older. One Wednesday Evening (Monthly: Volunteer Based).  $30 per visit, cash only  [place]  [phone] for adults; Children under age 40, call Graduate Pediatric Dentistry at [phone]. Children aged 52-14, please call [phone] to request a pediatric application.  Dental services are provided in all areas of dental care including fillings, crowns and bridges, complete and partial dentures, implants, gum treatment, root canals, and extractions. Preventive care is also provided. Treatment is provided to both adults and children. Patients are selected via a lottery and there is often a waiting list.   [facility] [address] [place]  [phone] [email or web address]   [facility] [address] [place], [place] [phone], Ext. [phone] Second and Fourth Thursday of each month, opens at 6:30 AM; Clinic ends at 9 AM.  Patients are seen on a first-come first-served basis, and a limited number are seen during each clinic.   [facility]  [address] Hillard Danker [place], [place] [phone]   Eligibility Requirements You must have lived in [place], [place], or [place] counties for at least the  last three months.   You cannot be eligible for state or federal sponsored [employer], including [employer], [place], or [employer].   You generally cannot be eligible for healthcare insurance through your employer.    How to apply: Eligibility screenings are held every Tuesday and Wednesday afternoon from 1:00 pm until 4:00 pm. You do not need an appointment for the interview!  [facility] [address] [place], [place]   [place]  [place] Department  [place]  [phone]    Behavioral Health Resources in the Community: Intensive Outpatient Programs Organization         Address  Phone  Notes  [place]ula Antigo. [address] [place], [place] [phone]   [facility] Outpatient [address] [place], [place]   ADS: Alcohol & Drug Svcs [address], [place], [place]   [place] [address],  [place], [place] or [phone]   Substance Abuse Resources Organization         Address  Phone  Notes  Alcohol and Drug Services  [phone]   [place]  [phone]   [facility] [place]   Chinita Pester  [phone]   Residential & Outpatient Substance Abuse Program  [phone]    Psychological Services Organization         Address  Phone  Notes  [facility] [place]  [place]  [phone]   [place] [address], [place] or [phone]    Mobile Crisis Teams Organization         Address  Phone  Notes  [facility]rapeutic Alternatives, Mobile Crisis Care Unit  [phone]   [facility]  [address] [place], [place]   [place] [address] [place] [phone]    Self-Help/Support Groups Organization         Address  Phone             Notes  [place]. of [place] - variety of support groups  [place] Call for more information  Narcotics Anonymous (NA), [facility] [address], [employer] [place]  2 meetings at this location   Special educational needs teacher         Address  Phone  Notes  [facility] Residential Treatment [place],    [place]  [phone]   [facility]  [address] [place] [phone], [place], [place]   [place], [place] [phone] Admissions: 8am-3pm M-F  Incentives Substance [place] [address]    [place], [place] [phone]   [facility] [address] [place], [place], [place]   [facility] [address],  [place], [place]   [facility] Programs - Intensive Outpatient [address] Kristeen [address], [place], [place]   [facility] ([place].,  [place], [place] [phone] or [phone]   [facility] (RTS) [address], [place], [place] Accepts Medicaid  [facility] [address],  [place] [phone] Substance Abuse/Addiction Treatment   [facility] Organization         Address  Phone  Notes  [facility]  [phone]  Domenic Schwab, PhD [address] Arlis Porta [place], [place]   [phone] or [phone]   [facility], [place] [phone]   [place], [place], [place] [phone] Insurance/Medicaid/sponsorship through [facility] and Families [address], [address] [place]                                    [place], [place] [phone] [place] [address], [place] [phone]    Dr. Adele Schilder  [phone]   [facility] Dept. 1) [address] [place] 2) [place] 3)  [place] [address], [place] [phone]  [phone]   [place] [phone] or [phone] (After Hours)

## 2015-12-26 ENCOUNTER — Encounter (HOSPITAL_COMMUNITY): Payer: Self-pay | Admitting: Emergency Medicine

## 2015-12-26 ENCOUNTER — Emergency Department (HOSPITAL_COMMUNITY)
Admission: EM | Admit: 2015-12-26 | Discharge: 2015-12-26 | Disposition: A | Discharge status: Home / Self Care | Payer: Commercial Managed Care - HMO | Attending: Emergency Medicine | Admitting: Emergency Medicine

## 2015-12-26 DIAGNOSIS — F17210 Nicotine dependence, cigarettes, uncomplicated: Secondary | ICD-10-CM | POA: Insufficient documentation

## 2015-12-26 DIAGNOSIS — J45909 Unspecified asthma, uncomplicated: Secondary | ICD-10-CM | POA: Diagnosis not present

## 2015-12-26 DIAGNOSIS — L0291 Cutaneous abscess, unspecified: Secondary | ICD-10-CM

## 2015-12-26 DIAGNOSIS — IMO0001 Reserved for inherently not codable concepts without codable children: Secondary | ICD-10-CM

## 2015-12-26 DIAGNOSIS — L02422 Furuncle of left axilla: Secondary | ICD-10-CM | POA: Insufficient documentation

## 2015-12-26 DIAGNOSIS — I10 Essential (primary) hypertension: Secondary | ICD-10-CM | POA: Insufficient documentation

## 2015-12-26 DIAGNOSIS — R030 Elevated blood-pressure reading, without diagnosis of hypertension: Secondary | ICD-10-CM

## 2015-12-26 MED ORDER — HYDROMORPHONE HCL 1 MG/ML IJ SOLN
1.0000 mg | INTRAMUSCULAR | Status: DC | PRN
  Filled 2015-12-26: qty 1

## 2015-12-26 MED ORDER — HYDROCHLOROTHIAZIDE 25 MG PO TABS: 25.0000 mg | ORAL_TABLET | Freq: Every day | ORAL | 0 refills | Status: DC

## 2015-12-26 MED ORDER — LIDOCAINE-EPINEPHRINE (PF) 2 %-1:200000 IJ SOLN
10.0000 mL | Freq: Once | INTRAMUSCULAR | Status: AC
  Administered 2015-12-26: 10 mL
  Filled 2015-12-26: qty 20

## 2015-12-26 MED ORDER — OXYCODONE-ACETAMINOPHEN 5-325 MG PO TABS
1.0000 | ORAL_TABLET | Freq: Once | ORAL | Status: AC
  Administered 2015-12-26: 1 via ORAL
  Filled 2015-12-26: qty 1

## 2015-12-26 MED ORDER — SULFAMETHOXAZOLE-TRIMETHOPRIM 800-160 MG PO TABS: 1.0000 | ORAL_TABLET | Freq: Two times a day (BID) | ORAL | 0 refills | Status: AC

## 2015-12-26 NOTE — ED Provider Notes (Signed)
[facility] DEPT Provider Note   CSN: [id number] Arrival date & time: 12/26/15  1450     History   Chief Complaint Chief Complaint  Patient presents with  . Hypertension  . Cellulitis    HPI Danielle [name] is a 40 y.o. female.  The history is provided by the patient and medical records. No language interpreter was used.  Hypertension  Pertinent negatives include no abdominal pain, no headaches and no shortness of breath.   Danielle [name] is a 40 y.o. female  with a PMH of abscesses, HTN presents to the Emergency Department complaining of a boil in left axilla that has gradually been worsening over the last 4 days. Hx of similar in this area. She has not been putting deodorant on and cleaning the area several times a day with soap and water. Denies fever, chills. No drainage from the site.   BP noticed to be elevated in triage. Patient states she has been out of her HCTZ for a few weeks now. No headache, visual changes, abdominal pain.  Past Medical History:  Diagnosis Date  . Anemia    WITH PREGNANCY  . Arm fracture AGE 52  . Asthma 08/2011   ALBUTEROL  INHALER PRN  . Headache(784.0)    MIGRAINES RESOLVED  . Hypertension 2001  . Infection    UTI X 1  . Pregnancy induced hypertension   . Preterm delivery, delivered 04/10/2012  . Preterm labor 1995  . Trichimoniasis   . Yeast infection     Patient Active Problem List   Diagnosis Date Noted  . Preterm labor 03/23/2012  . Flu 03/04/2012  . Asthma complicating pregnancy, antepartum 02/22/2012  . Smoker 02/22/2012  . 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 WISDOM TOOTH EXTRACTION      OB History    Gravida Para Term Preterm AB Living   5 5 4 1   4    SAB TAB Ectopic Multiple Live Births           4       Home Medications    Prior to Admission medications   Medication Sig Start Date End Date Taking? Authorizing Provider  albuterol (PROVENTIL  HFA;VENTOLIN HFA) 108 (90 BASE) MCG/ACT inhaler Inhale 2 puffs into the lungs every 6 (six) hours as needed. Shortness of breath 02/04/12   Donnel Saxon, CNM  clindamycin (CLEOCIN) 300 MG capsule Take 1 capsule (300 mg total) by mouth 3 (three) times daily. 02/21/14   Gregor Hams, MD  hydrochlorothiazide (HYDRODIURIL) 25 MG tablet Take 1 tablet (25 mg total) by mouth daily. 12/26/15   Ozella Almond Ward, PA-C  sulfamethoxazole-trimethoprim (BACTRIM DS,SEPTRA DS) 800-160 MG tablet Take 1 tablet by mouth 2 (two) times daily. 12/26/15 01/02/16  [place], PA-C    Family History Family History  Problem Relation Age of Onset  . Diabetes Mother   . Hypertension Mother   . Drug abuse Mother   . Hypertension Sister   . Heart disease Maternal Grandfather   . Diabetes Maternal Grandfather   . Hypertension Maternal Grandfather   . Diabetes Paternal Aunt   . Cancer Paternal Grandmother   . Stroke Paternal Grandfather   . Hypertension Paternal Grandfather   . Cancer Paternal Grandfather     Social History Social History  Substance Use Topics  . Smoking status: Current Every Day Smoker    Packs/day: 0.50    Years: 20.00    Types:  Cigarettes  . Smokeless tobacco: Never Used  . Alcohol use No     Comment: occ     Allergies   Penicillins   Review of Systems Review of Systems  Constitutional: Negative for chills and fever.  HENT: Negative for congestion.   Eyes: Negative for visual disturbance.  Respiratory: Negative for cough and shortness of breath.   Cardiovascular: Negative.   Gastrointestinal: Negative for abdominal pain, nausea and vomiting.  Genitourinary: Negative for dysuria.  Musculoskeletal: Negative for back pain and neck pain.  Skin: Positive for wound.  Neurological: Negative for headaches.     Physical Exam Updated Vital Signs BP 151/93   Pulse 60   Temp 98.4 F (36.9 C) (Oral)   Resp 18   SpO2 95%   Physical Exam  Constitutional: She is oriented to  person, place, and time. She appears well-developed and well-nourished. No distress.  HENT:  Head: Normocephalic and atraumatic.  Cardiovascular: Normal rate, regular rhythm and normal heart sounds.   Pulmonary/Chest: Effort normal and breath sounds normal. No respiratory distress.  Abdominal: Soft. She exhibits no distension. There is no tenderness.  Musculoskeletal: She exhibits no edema.  Neurological: She is alert and oriented to person, place, and time.  Skin: Skin is warm and dry.  3x3 abscess in left axilla with no surrounding erythema.   Nursing note and vitals reviewed.    ED Treatments / Results  Labs (all labs ordered are listed, but only abnormal results are displayed) Labs Reviewed - No data to display  EKG  EKG Interpretation None       Radiology No results found.  Procedures Procedures (including critical care time)  INCISION AND DRAINAGE Performed by: Ozella Almond Ward Consent: Verbal consent obtained. Risks and benefits: risks, benefits and alternatives were discussed Type: abscess Body area: left axilla Anesthesia: local infiltration Incision was made with a scalpel. Local anesthetic: lidocaine 2% with epinephrine Anesthetic total: 5 ml Complexity: complex Blunt dissection to break up loculations Drainage: purulent Drainage amount: large  Packing material: 1/4 in iodoform gauze Patient tolerance: Patient tolerated the procedure well with no immediate complications.   Medications Ordered in ED Medications  HYDROmorphone (DILAUDID) injection 1 mg (not administered)  oxyCODONE-acetaminophen (PERCOCET/ROXICET) 5-325 MG per tablet 1 tablet (1 tablet Oral Given 12/26/15 1958)  lidocaine-EPINEPHrine (XYLOCAINE W/EPI) 2 %-1:200000 (PF) injection 10 mL (10 mLs Other Given 12/26/15 2030)     Initial Impression / Assessment and Plan / ED Course  I have reviewed the triage vital signs and the nursing notes.  Pertinent labs & imaging results that were  available during my care of the patient were reviewed by me and considered in my medical decision making (see chart for details).  Clinical Course   Patient presenting with abscess requiring incision and drainage. No crepitance to suggest necrotizing fasciitis. Incision and drainage performed per procedure note. Patient tolerated the procedure well. Patient was prescribed Bactrim. Wound care instructions discussed.  Wound check in 2-3 days. Return to ER if concern for spread of infection, increasing pain, fevers, or other concerns.   Patient with elevated BP during ED stay. Asymptomatic. On HCTZ but has been out and not taking medication. Stressed the importance of regular PCP follow up to avoid running out of medication. Refilled BP meds for 1 month -  Follow up with PCP for BP check this week.   Return precautions discussed and all questions answered.    Final Clinical Impressions(s) / ED Diagnoses   Final diagnoses:  Abscess  Elevated blood pressure    New Prescriptions Discharge Medication List as of 12/26/2015  9:49 PM    START taking these medications   Details  sulfamethoxazole-trimethoprim (BACTRIM DS,SEPTRA DS) 800-160 MG tablet Take 1 tablet by mouth 2 (two) times daily., Starting Tue 12/26/2015, Until Tue 01/02/2016, Print         [employer] Ward, PA-C 12/26/15 2357    Varney Biles, MD 12/27/15 1515

## 2015-12-26 NOTE — ED Triage Notes (Signed)
Pt sts hx of htn out of meds x 1 week and also has abscess under left arm

## 2015-12-26 NOTE — ED Notes (Signed)
Pt states that abscess under her left axillary has popped. Area dressed with gauze EDP aware.

## 2015-12-26 NOTE — Discharge Instructions (Signed)
Follow up with your primary care provider for blood pressure check at next available appointment, preferably within the next 2 weeks.  Please take all of your antibiotics until finished!   Follow up with your doctor, an urgent care, or return to ED in order to remove your packing in 48-72 hours. You may return to the emergency department if you have a fever that persists greater than 101 or your abscess appears to become infected (growing surrounding redness and warmth).   Abscess An abscess (boil or furuncle) is an infected area that contains a collection of pus.   SYMPTOMS Signs and symptoms of an abscess include pain, tenderness, redness, or hardness. You may feel a moveable soft area under your skin. An abscess can occur anywhere in the body.   TREATMENT  A surgical cut (incision) may be made over your abscess to drain the pus. Gauze may be packed into the space or a drain may be looped through the abscess cavity (pocket). This provides a drain that will allow the cavity to heal from the inside outwards. The abscess may be painful for a few days, but should feel much better if it was drained.  Your abscess, if seen early, may not have localized and may not have been drained. If not, another appointment may be required if it does not get better on its own or with medications.  HOME CARE INSTRUCTIONS  Only take over-the-counter or prescription medicines for pain, discomfort, or fever as directed by your caregiver.  Take your antibiotics as directed if they were prescribed. Finish them even if you start to feel better.  Keep the skin and clothes clean around your abscess.  If the abscess was drained, you will need to use gauze dressing to collect any draining pus. Dressings will typically need to be changed 3 or more times a day.  The infection may spread by skin contact with others. Avoid skin contact as much as possible.  Practice good hygiene. This includes regular hand washing, cover any  draining skin lesions, and do not share personal care items.  If you participate in sports, do not share athletic equipment, towels, whirlpools, or personal care items. Shower after every practice or tournament.  If a draining area cannot be adequately covered:  Do not participate in sports.  Children should not participate in day care until the wound has healed or drainage stops.  If your caregiver has given you a follow-up appointment, it is very important to keep that appointment. Not keeping the appointment could result in a much worse infection, chronic or permanent injury, pain, and disability. If there is any problem keeping the appointment, you must call back to this facility for assistance.   SEEK MEDICAL CARE IF:  You develop increased pain, swelling, redness, drainage, or bleeding in the wound site.  You develop signs of generalized infection including muscle aches, chills, fever, or a general ill feeling.  You have an oral temperature above 102 F (38.9 C).  MAKE SURE YOU:  Understand these instructions.  Will watch your condition.  Will get help right away if you are not doing well or get worse.  Document Released: 01/30/2005 Document Revised: 01/02/2011 Document Reviewed: 11/24/2007 [facility] Patient Information 2012 [place].

## 2015-12-27 NOTE — ED Notes (Signed)
Dilaudid 1mg  walked down and given to pharmacy for return of unused dose

## 2016-08-26 ENCOUNTER — Emergency Department (HOSPITAL_COMMUNITY)
Admission: EM | Admit: 2016-08-26 | Discharge: 2016-08-26 | Disposition: A | Discharge status: Home / Self Care | Payer: Commercial Managed Care - HMO | Attending: Emergency Medicine | Admitting: Emergency Medicine

## 2016-08-26 ENCOUNTER — Encounter (HOSPITAL_COMMUNITY): Payer: Self-pay | Admitting: *Deleted

## 2016-08-26 DIAGNOSIS — L02412 Cutaneous abscess of left axilla: Secondary | ICD-10-CM | POA: Insufficient documentation

## 2016-08-26 DIAGNOSIS — F17210 Nicotine dependence, cigarettes, uncomplicated: Secondary | ICD-10-CM | POA: Diagnosis not present

## 2016-08-26 DIAGNOSIS — J45909 Unspecified asthma, uncomplicated: Secondary | ICD-10-CM | POA: Insufficient documentation

## 2016-08-26 DIAGNOSIS — I10 Essential (primary) hypertension: Secondary | ICD-10-CM | POA: Diagnosis not present

## 2016-08-26 DIAGNOSIS — L732 Hidradenitis suppurativa: Secondary | ICD-10-CM | POA: Insufficient documentation

## 2016-08-26 MED ORDER — KETOROLAC TROMETHAMINE 30 MG/ML IJ SOLN
30.0000 mg | Freq: Once | INTRAMUSCULAR | Status: AC
  Administered 2016-08-26: 30 mg via INTRAMUSCULAR
  Filled 2016-08-26: qty 1

## 2016-08-26 MED ORDER — LIDOCAINE HCL 2 % IJ SOLN
10.0000 mL | Freq: Once | INTRAMUSCULAR | Status: AC
  Administered 2016-08-26: 200 mg
  Filled 2016-08-26: qty 20

## 2016-08-26 MED ORDER — LIDOCAINE-EPINEPHRINE-TETRACAINE (LET) SOLUTION
3.0000 mL | Freq: Once | NASAL | Status: AC
  Administered 2016-08-26: 3 mL via TOPICAL
  Filled 2016-08-26: qty 3

## 2016-08-26 MED ORDER — CEPHALEXIN 500 MG PO CAPS: 500.0000 mg | ORAL_CAPSULE | Freq: Four times a day (QID) | ORAL | 0 refills | Status: AC

## 2016-08-26 NOTE — ED Triage Notes (Signed)
Pt complains of boil to left underarm for the past week. Pt states she has had boil in similar area in the past.

## 2016-08-26 NOTE — ED Provider Notes (Signed)
[facility] DEPT Provider Note    By signing my name below, I, Bea Graff, attest that this documentation has been prepared under the direction and in the presence of Martinique Russo, PA-C. Electronically Signed: Bea Graff, ED Scribe. 08/26/16. 6:49 PM.    History   Chief Complaint Chief Complaint  Patient presents with  . Abscess   The history is provided by the patient and medical records. No language interpreter was used.    Danielle [name] is a 41 y.o. female with PMHx of hidradenitis suppurativa who presents to the Emergency Department complaining of an abscess to the left axilla that appeared about one week ago. She reports associated drainage and severe pain around the area of the abscess but not of the abscess itself. She states she had some left over Bactrim that she has taken, Aleve and Tylenol with no significant relief. Moving her LUE increases her pain. She denies alleviating factors. She denies fever, chills, nausea, vomiting. Her PCP is at [facility]. She has not seen a surgeon for her hidradenitis.   Past Medical History:  Diagnosis Date  . Anemia    WITH PREGNANCY  . Arm fracture AGE 40  . Asthma 08/2011   ALBUTEROL  INHALER PRN  . Headache(784.0)    MIGRAINES RESOLVED  . Hypertension 2001  . Infection    UTI X 1  . Pregnancy induced hypertension   . Preterm delivery, delivered 04/10/2012  . Preterm labor 1995  . Trichimoniasis   . Yeast infection     Patient Active Problem List   Diagnosis Date Noted  . Preterm labor 03/23/2012  . Flu 03/04/2012  . Asthma complicating pregnancy, antepartum 02/22/2012  . Smoker 02/22/2012  . 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 WISDOM TOOTH EXTRACTION      OB History    Gravida Para Term Preterm AB Living   5 5 4 1   4    SAB TAB Ectopic Multiple Live Births           4       Home Medications    Prior to Admission  medications   Medication Sig Start Date End Date Taking? Authorizing Provider  albuterol (PROVENTIL HFA;VENTOLIN HFA) 108 (90 BASE) MCG/ACT inhaler Inhale 2 puffs into the lungs every 6 (six) hours as needed. Shortness of breath 02/04/12   Donnel Saxon, CNM  cephALEXin (KEFLEX) 500 MG capsule Take 1 capsule (500 mg total) by mouth 4 (four) times daily. 08/26/16 09/02/16  Martinique N Russo, PA-C  clindamycin (CLEOCIN) 300 MG capsule Take 1 capsule (300 mg total) by mouth 3 (three) times daily. 02/21/14   Gregor Hams, MD  hydrochlorothiazide (HYDRODIURIL) 25 MG tablet Take 1 tablet (25 mg total) by mouth daily. 12/26/15   Eagar, PA-C    Family History Family History  Problem Relation Age of Onset  . Diabetes Mother   . Hypertension Mother   . Drug abuse Mother   . Heart disease Maternal Grandfather   . Diabetes Maternal Grandfather   . Hypertension Maternal Grandfather   . Stroke Paternal Grandfather   . Hypertension Paternal Grandfather   . Cancer Paternal Grandfather   . Hypertension Sister   . Diabetes Paternal Aunt   . Cancer Paternal Grandmother     Social History Social History  Substance Use Topics  . Smoking status: Current Every Day Smoker    Packs/day: 0.50    Years: 20.00  Types: Cigarettes  . Smokeless tobacco: Never Used  . Alcohol use No     Comment: occ     Allergies   Penicillins   Review of Systems Review of Systems  Constitutional: Negative for chills and fever.  Gastrointestinal: Negative for nausea and vomiting.  Skin:       Abscess to left axilla     Physical Exam Updated Vital Signs BP (!) 141/94 (BP Location: Right Arm)   Pulse 95   Temp 98.7 F (37.1 C) (Oral)   Resp 18   LMP 07/29/2016   SpO2 100%   Physical Exam  Constitutional: She appears well-developed and well-nourished.  HENT:  Head: Normocephalic and atraumatic.  Eyes: Conjunctivae are normal.  Cardiovascular: Normal rate.   Pulmonary/Chest: Effort normal.    Skin:  4 cm fluctuant abscess without cellulitis. Track draining posteriorly.  Psychiatric: She has a normal mood and affect. Her behavior is normal.  Nursing note and vitals reviewed.   ED Treatments / Results  DIAGNOSTIC STUDIES: Oxygen Saturation is 100% on RA, normal by my interpretation.   COORDINATION OF CARE: 4:47 PM- Will incise and drain abscess. Will order pain medication prior to procedure. Pt verbalizes understanding and agrees to plan.  Medications  lidocaine-EPINEPHrine-tetracaine (LET) solution (3 mLs Topical Given 08/26/16 1721)  lidocaine (XYLOCAINE) 2 % (with pres) injection 200 mg (200 mg Infiltration Given by Other 08/26/16 1734)  ketorolac (TORADOL) 30 MG/ML injection 30 mg (30 mg Intramuscular Given 08/26/16 1801)    Labs (all labs ordered are listed, but only abnormal results are displayed) Labs Reviewed - No data to display  EKG  EKG Interpretation None       Radiology No results found.  Procedures .Marland KitchenIncision and Drainage Date/Time: 08/26/2016 6:09 PM Performed by: RUSSO, Martinique N Authorized by: RUSSO, Martinique N   Consent:    Consent obtained:  Verbal   Consent given by:  Patient   Risks discussed:  Bleeding, incomplete drainage, pain and infection   Alternatives discussed:  No treatment Location:    Type:  Abscess   Size:  4 cm   Location:  Upper extremity   Upper extremity location: L axilla. Pre-procedure details:    Skin preparation:  Betadine Anesthesia (see MAR for exact dosages):    Anesthesia method:  Local infiltration and topical application   Topical anesthetic:  LET   Local anesthetic:  Lidocaine 2% w/o epi Procedure type:    Complexity:  Simple Procedure details:    Needle aspiration: no     Incision types:  Single straight (2 straight)   Incision depth:  Subcutaneous   Scalpel blade:  11   Wound management:  Probed and deloculated and irrigated with saline   Drainage:  Purulent (malodorous)   Drainage amount:   Copious   Packing materials:  1/4 in iodoform gauze   Amount 1/4" iodoform:  8 inches Post-procedure details:    Patient tolerance of procedure:  Tolerated well, no immediate complications Comments:     2 straight incisions made: 1st incision laid in skin fold after abscess drained. 2nd incision made for continuous drainage and packing.       (including critical care time)  Medications Ordered in ED Medications  lidocaine-EPINEPHrine-tetracaine (LET) solution (3 mLs Topical Given 08/26/16 1721)  lidocaine (XYLOCAINE) 2 % (with pres) injection 200 mg (200 mg Infiltration Given by Other 08/26/16 1734)  ketorolac (TORADOL) 30 MG/ML injection 30 mg (30 mg Intramuscular Given 08/26/16 1801)     Initial Impression /  Assessment and Plan / ED Course  I have reviewed the triage vital signs and the nursing notes.  Pertinent labs & imaging results that were available during my care of the patient were reviewed by me and considered in my medical decision making (see chart for details).     Patient with L axilla skin abscess 2/t hidradenitis suppurativa. Pt with severe pain with previous I&D, gave IM toradol and topical LET for added pain relief.  Incision and drainage performed in the ED today.  Abscess was large enough to warrant packing and 1/4 inch Iodoform packing was placed. Wound recheck in 2 days. Supportive care and return precautions discussed.  Pt sent home with Keflex which she reports taking in the past without issue. The patient appears reasonably screened and/or stabilized for discharge and I doubt any other emergent medical condition requiring further screening, evaluation, or treatment in the ED prior to discharge.  Discussed results, findings, treatment and follow up. Patient advised of return precautions. Patient verbalized understanding and agreed with plan.    Final Clinical Impressions(s) / ED Diagnoses   Final diagnoses:  Hidradenitis suppurativa of left axilla  Abscess  of left axilla    New Prescriptions Discharge Medication List as of 08/26/2016  6:47 PM    START taking these medications   Details  cephALEXin (KEFLEX) 500 MG capsule Take 1 capsule (500 mg total) by mouth 4 (four) times daily., Starting Mon 08/26/2016, Until Mon 09/02/2016, Print        I personally performed the services described in this documentation, which was scribed in my presence. The recorded information has been reviewed and is accurate.    Martinique N Russo, PA-C 08/26/16 1941    Martinique N Russo, PA-C 08/26/16 1944    Julianne Rice, MD 08/27/16 [id number]

## 2016-08-26 NOTE — Discharge Instructions (Signed)
Please read instructions below. You need to follow up with your primary care in 2-3 days for wound recheck and to remove the packing. Return to ER for fever, increasing pain, new or worsening symptoms.

## 2017-10-04 ENCOUNTER — Other Ambulatory Visit: Payer: Self-pay

## 2017-10-04 ENCOUNTER — Emergency Department (HOSPITAL_COMMUNITY)
Admission: EM | Admit: 2017-10-04 | Discharge: 2017-10-04 | Disposition: A | Discharge status: Home / Self Care | Payer: 59 | Attending: Emergency Medicine | Admitting: Emergency Medicine

## 2017-10-04 ENCOUNTER — Encounter (HOSPITAL_COMMUNITY): Payer: Self-pay

## 2017-10-04 DIAGNOSIS — I160 Hypertensive urgency: Secondary | ICD-10-CM | POA: Insufficient documentation

## 2017-10-04 DIAGNOSIS — B029 Zoster without complications: Secondary | ICD-10-CM

## 2017-10-04 DIAGNOSIS — F17210 Nicotine dependence, cigarettes, uncomplicated: Secondary | ICD-10-CM | POA: Diagnosis not present

## 2017-10-04 DIAGNOSIS — R21 Rash and other nonspecific skin eruption: Secondary | ICD-10-CM | POA: Diagnosis present

## 2017-10-04 MED ORDER — HYDROCHLOROTHIAZIDE 25 MG PO TABS: 25.0000 mg | ORAL_TABLET | Freq: Every day | ORAL | 0 refills | Status: AC

## 2017-10-04 MED ORDER — HYDROCHLOROTHIAZIDE 12.5 MG PO CAPS
25.0000 mg | ORAL_CAPSULE | Freq: Once | ORAL | Status: AC
  Administered 2017-10-04: 25 mg via ORAL
  Filled 2017-10-04: qty 2

## 2017-10-04 MED ORDER — VALACYCLOVIR HCL 500 MG PO TABS
1000.0000 mg | ORAL_TABLET | Freq: Once | ORAL | Status: AC
  Administered 2017-10-04: 1000 mg via ORAL
  Filled 2017-10-04: qty 2

## 2017-10-04 MED ORDER — VALACYCLOVIR HCL 1 G PO TABS: 1000.0000 mg | ORAL_TABLET | Freq: Three times a day (TID) | ORAL | 0 refills | Status: AC

## 2017-10-04 MED ORDER — TRAMADOL HCL 50 MG PO TABS: 50.0000 mg | ORAL_TABLET | Freq: Four times a day (QID) | ORAL | 0 refills | Status: AC | PRN

## 2017-10-04 NOTE — ED Provider Notes (Signed)
[place] DEPT Provider Note   CSN: [id number] Arrival date & time: 10/04/17  1540     History   Chief Complaint Chief Complaint  Patient presents with  . Rash    HPI Danielle [name] is a 42 y.o. female.  HPI Patient presents with concern of rash, pain. Onset was yesterday, and over the past 24 hours the pain and rash have progressed to include the entire area just superior to her right hip, from the mid abdomen to the mid paraspinal area. There is associated sharp pain in this area, no other complaints including nausea, vomiting, fever, chills. No clear precipitant. No medication taken for pain relief. Incidentally, patient also notes that she stopped taking blood pressure medication 2 months ago after it was recalled, has not taken any medication since that time.  Past Medical History:  Diagnosis Date  . Anemia    WITH PREGNANCY  . Arm fracture AGE 35  . Asthma 08/2011   ALBUTEROL  INHALER PRN  . Headache(784.0)    MIGRAINES RESOLVED  . Hypertension 2001  . Infection    UTI X 1  . Pregnancy induced hypertension   . Preterm delivery, delivered 04/10/2012  . Preterm labor 1995  . Trichimoniasis   . Yeast infection     Patient Active Problem List   Diagnosis Date Noted  . Preterm labor 03/23/2012  . Flu 03/04/2012  . Asthma complicating pregnancy, antepartum 02/22/2012  . Smoker 02/22/2012  . 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 WISDOM TOOTH EXTRACTION       OB History    Gravida  5   Para  5   Term  4   Preterm  1   AB      Living  4     SAB      TAB      Ectopic      Multiple      Live Births  4            Home Medications    Prior to Admission medications   Medication Sig Start Date End Date Taking? Authorizing Provider  albuterol (PROVENTIL HFA;VENTOLIN HFA) 108 (90 BASE) MCG/ACT inhaler Inhale 2 puffs into the lungs every 6 (six) hours  as needed. Shortness of breath 02/04/12   Donnel Saxon, CNM  clindamycin (CLEOCIN) 300 MG capsule Take 1 capsule (300 mg total) by mouth 3 (three) times daily. 02/21/14   Gregor Hams, MD  hydrochlorothiazide (HYDRODIURIL) 25 MG tablet Take 1 tablet (25 mg total) by mouth daily. 10/04/17   Carmin Muskrat, MD  traMADol (ULTRAM) 50 MG tablet Take 1 tablet (50 mg total) by mouth every 6 (six) hours as needed. 10/04/17   Carmin Muskrat, MD  valACYclovir (VALTREX) 1000 MG tablet Take 1 tablet (1,000 mg total) by mouth 3 (three) times daily for 7 days. 10/04/17 10/11/17  Carmin Muskrat, MD    Family History Family History  Problem Relation Age of Onset  . Diabetes Mother   . Hypertension Mother   . Drug abuse Mother   . Heart disease Maternal Grandfather   . Diabetes Maternal Grandfather   . Hypertension Maternal Grandfather   . Stroke Paternal Grandfather   . Hypertension Paternal Grandfather   . Cancer Paternal Grandfather   . Hypertension Sister   . Diabetes Paternal Aunt   . Cancer Paternal Grandmother     Social History Social History   Tobacco  Use  . Smoking status: Current Every Day Smoker    Packs/day: 0.50    Years: 20.00    Pack years: 10.00    Types: Cigarettes  . Smokeless tobacco: Never Used  Substance Use Topics  . Alcohol use: No    Comment: occ  . Drug use: No     Allergies   Penicillins   Review of Systems Review of Systems  Constitutional:       Per HPI, otherwise negative  HENT:       Per HPI, otherwise negative  Respiratory:       Per HPI, otherwise negative  Cardiovascular:       Per HPI, otherwise negative  Gastrointestinal: Negative for vomiting.  Endocrine:       Negative aside from HPI  Genitourinary:       Neg aside from HPI   Musculoskeletal:       Per HPI, otherwise negative  Skin: Positive for rash.  Neurological: Negative for syncope.     Physical Exam Updated Vital Signs BP (!) 193/112 (BP Location: Left Arm)   Pulse (!)  106   Temp 98.4 F (36.9 C) (Oral)   Resp 18   LMP 09/17/2017 (Exact Date)   SpO2 97%   Physical Exam  Constitutional: She is oriented to person, place, and time. She appears well-developed and well-nourished. No distress.  HENT:  Head: Normocephalic and atraumatic.  Eyes: Conjunctivae and EOM are normal.  Pulmonary/Chest: Effort normal. No respiratory distress.  Abdominal: She exhibits no distension.  Musculoskeletal: She exhibits no edema.  Neurological: She is alert and oriented to person, place, and time. No cranial nerve deficit.  Skin: Skin is warm and dry.     Psychiatric: She has a normal mood and affect.  Nursing note and vitals reviewed.    ED Treatments / Results   Procedures Procedures (including critical care time)  Medications Ordered in ED Medications  valACYclovir (VALTREX) tablet 1,000 mg (has no administration in time range)  hydrochlorothiazide (MICROZIDE) capsule 25 mg (has no administration in time range)     Initial Impression / Assessment and Plan / ED Course  I have reviewed the triage vital signs and the nursing notes.  Pertinent labs & imaging results that were available during my care of the patient were reviewed by me and considered in my medical decision making (see chart for details).  Presents with concern of rash. Patient found to have a rash consistent with shingles, without other neurologic phenomena. Patient was found to have hypertension, and given her history, there is suspicion for ongoing poorly controlled hypertension, though without evidence for complaints, there is low suspicion for endorgan effects. Patient will be provided a new antihypertensive medication, be discharged with close outpatient follow-up.  Final Clinical Impressions(s) / ED Diagnoses   Final diagnoses:  Hypertensive urgency  Shingles  ED Discharge Orders        Ordered    hydrochlorothiazide (HYDRODIURIL) 25 MG tablet  Daily     10/04/17 1026     valACYclovir (VALTREX) 1000 MG tablet  3 times daily     10/04/17 1026    traMADol (ULTRAM) 50 MG tablet  Every 6 hours PRN     10/04/17 1026       Carmin Muskrat, MD 10/04/17 1035

## 2017-10-04 NOTE — ED Notes (Signed)
Bed: [facility] Expected date:  Expected time:  Means of arrival:  Comments: 

## 2017-10-04 NOTE — ED Triage Notes (Signed)
She reports painful rash at right torso, at which she has a patchy area of vesicles. She also tells me she wishes to be prescribed something for her hypertension. She tells me that she was formerly on Losartan.

## 2017-10-04 NOTE — Discharge Instructions (Signed)
Please be sure to schedule a follow-up with our affiliated primary care clinic.  Return here for concerning changes in your condition.

## 2019-10-28 ENCOUNTER — Other Ambulatory Visit: Payer: Self-pay | Admitting: Internal Medicine

## 2019-10-28 DIAGNOSIS — E010 Iodine-deficiency related diffuse (endemic) goiter: Secondary | ICD-10-CM

## 2019-11-10 ENCOUNTER — Ambulatory Visit
Admission: RE | Admit: 2019-11-10 | Discharge: 2019-11-10 | Disposition: A | Discharge status: Home / Self Care | Payer: 59 | Source: Ambulatory Visit | Attending: Internal Medicine | Admitting: Internal Medicine

## 2019-11-10 DIAGNOSIS — E010 Iodine-deficiency related diffuse (endemic) goiter: Secondary | ICD-10-CM

## 2019-11-17 ENCOUNTER — Other Ambulatory Visit: Payer: Self-pay | Admitting: Internal Medicine

## 2019-11-17 DIAGNOSIS — D352 Benign neoplasm of pituitary gland: Secondary | ICD-10-CM

## 2019-11-17 DIAGNOSIS — N912 Amenorrhea, unspecified: Secondary | ICD-10-CM

## 2019-12-11 ENCOUNTER — Other Ambulatory Visit: Payer: 59

## 2019-12-27 ENCOUNTER — Emergency Department (HOSPITAL_BASED_OUTPATIENT_CLINIC_OR_DEPARTMENT_OTHER)
Admission: EM | Admit: 2019-12-27 | Discharge: 2019-12-27 | Disposition: A | Discharge status: Home / Self Care | Payer: 59 | Attending: Emergency Medicine | Admitting: Emergency Medicine

## 2019-12-27 ENCOUNTER — Encounter (HOSPITAL_BASED_OUTPATIENT_CLINIC_OR_DEPARTMENT_OTHER): Payer: Self-pay | Admitting: *Deleted

## 2019-12-27 ENCOUNTER — Other Ambulatory Visit: Payer: Self-pay

## 2019-12-27 DIAGNOSIS — R739 Hyperglycemia, unspecified: Secondary | ICD-10-CM

## 2019-12-27 DIAGNOSIS — J45909 Unspecified asthma, uncomplicated: Secondary | ICD-10-CM | POA: Diagnosis not present

## 2019-12-27 DIAGNOSIS — E1165 Type 2 diabetes mellitus with hyperglycemia: Secondary | ICD-10-CM | POA: Diagnosis present

## 2019-12-27 DIAGNOSIS — F17210 Nicotine dependence, cigarettes, uncomplicated: Secondary | ICD-10-CM | POA: Insufficient documentation

## 2019-12-27 DIAGNOSIS — Z79899 Other long term (current) drug therapy: Secondary | ICD-10-CM | POA: Insufficient documentation

## 2019-12-27 HISTORY — DX: Type 2 diabetes mellitus without complications: E11.9

## 2019-12-27 LAB — CBC
HCT: 42.3 % (ref 36.0–46.0)
Hemoglobin: 13.6 g/dL (ref 12.0–15.0)
MCH: 27.4 pg (ref 26.0–34.0)
MCHC: 32.2 g/dL (ref 30.0–36.0)
MCV: 85.1 fL (ref 80.0–100.0)
Platelets: 295 10*3/uL (ref 150–400)
RBC: 4.97 MIL/uL (ref 3.87–5.11)
RDW: 13 % (ref 11.5–15.5)
WBC: 9.7 10*3/uL (ref 4.0–10.5)
nRBC: 0 % (ref 0.0–0.2)

## 2019-12-27 LAB — BASIC METABOLIC PANEL
Anion gap: 13 (ref 5–15)
BUN: 18 mg/dL (ref 6–20)
CO2: 22 mmol/L (ref 22–32)
Calcium: 9.8 mg/dL (ref 8.9–10.3)
Chloride: 94 mmol/L — ABNORMAL LOW (ref 98–111)
Creatinine, Ser: 0.95 mg/dL (ref 0.44–1.00)
GFR calc Af Amer: >60 mL/min (ref >60)
GFR calc non Af Amer: >60 mL/min (ref >60)
Glucose, Bld: 404 mg/dL — ABNORMAL HIGH (ref 70–99)
Potassium: 3.6 mmol/L (ref 3.5–5.1)
Sodium: 129 mmol/L — ABNORMAL LOW (ref 135–145)

## 2019-12-27 LAB — PREGNANCY, URINE: Preg Test, Ur: NEGATIVE

## 2019-12-27 LAB — URINALYSIS, ROUTINE W REFLEX MICROSCOPIC
Bilirubin Urine: NEGATIVE
Glucose, UA: >=500 mg/dL — AB
Ketones, ur: 15 mg/dL — AB
Leukocytes,Ua: NEGATIVE
Nitrite: NEGATIVE
Protein, ur: 100 mg/dL — AB
Specific Gravity, Urine: 1.02 (ref 1.005–1.030)
pH: 5.5 (ref 5.0–8.0)

## 2019-12-27 LAB — URINALYSIS, MICROSCOPIC (REFLEX)

## 2019-12-27 LAB — CBG MONITORING, ED
Glucose-Capillary: 417 mg/dL — ABNORMAL HIGH (ref 70–99)
Glucose-Capillary: 293 mg/dL — ABNORMAL HIGH (ref 70–99)

## 2019-12-27 MED ORDER — INSULIN ASPART 100 UNIT/ML ~~LOC~~ SOLN
SUBCUTANEOUS | Status: AC
  Administered 2019-12-27: 8 [IU]
  Filled 2019-12-27: qty 8

## 2019-12-27 MED ORDER — LIVING WELL WITH DIABETES BOOK
Status: AC
  Administered 2019-12-27: 1
  Filled 2019-12-27: qty 1

## 2019-12-27 MED ORDER — LIVING WELL WITH DIABETES BOOK: Freq: Once | Status: AC

## 2019-12-27 MED ORDER — INSULIN ASPART PROT & ASPART (70-30 MIX) 100 UNIT/ML ~~LOC~~ SUSP: 8.0000 [IU] | Freq: Once | SUBCUTANEOUS | Status: DC

## 2019-12-27 MED ORDER — SODIUM CHLORIDE 0.9 % IV BOLUS
1000.0000 mL | Freq: Once | INTRAVENOUS | Status: AC
  Administered 2019-12-27: 1000 mL via INTRAVENOUS

## 2019-12-27 MED ORDER — INSULIN ASPART 100 UNIT/ML ~~LOC~~ SOLN: 8.0000 [IU] | Freq: Once | SUBCUTANEOUS | Status: AC

## 2019-12-27 MED ORDER — INSULIN REGULAR HUMAN 100 UNIT/ML IJ SOLN
8.0000 [IU] | Freq: Once | INTRAMUSCULAR | Status: DC
  Filled 2019-12-27: qty 3

## 2019-12-27 NOTE — Discharge Instructions (Addendum)
Your glucose levels decreased with IV fluids and 8 units of insulin  No evidence of diabetic crisis  Continue insulin and metformin as prescribed  Go to your appointment with your endocrinologist  Return for persistently elevated glucose > 500, vomiting, abdominal pain

## 2019-12-27 NOTE — ED Triage Notes (Signed)
New onset diabetic. She was started on oral and injection control but is not able to get her blood sugars under control. Her has blood sugar was 461 3 hours ago.

## 2019-12-27 NOTE — ED Provider Notes (Addendum)
[facility] EMERGENCY DEPARTMENT Provider Note   CSN: [id number] Arrival date & time: 12/27/19  1023     History Chief Complaint  Patient presents with  . Hyperglycemia    Danielle [name] is a 44 y.o. female with history of hypertension, recently diagnosed diabetes presents to the ED for concern of persistently elevated glucose levels at home despite medication adherence and diet modifications.  Patient states 2 to 3 weeks ago she had blurred vision.  PCP referred her to an ophthalmologist who stated nothing was wrong with her eyes and told her to be tested for diabetes and to manage her high blood pressure with her primary care doctor.  Seen recently by PCP who recommended increase of Metformin to 1000 mg twice a day but states she cannot tolerate this because it makes her very nauseous and lightheaded.  She saw her endocrinologist at [facility] Dr. Nehemiah Settle who started her on insulin.  States while in the office her glucose was 550 and they recommended insulin and ER transfer but she declined and left.  States later that day she felt bad and called her endocrinologist again and they prescribed her insulin to take at home.  She was told to take 30 units daily along with Metformin.  Over the weekend her glucose level has been in the 300-400 range.  Yesterday she took 40 units because her mother who is also diabetic told her to give herself more insulin to see if it would help her glucose levels.  Despite insulin and Metformin her CBG this morning was 461.  States all through the weekend and this morning she has tried to call her endocrinologist but has not been able to get recommendations.  She called this morning and they have no appointment openings until next Tuesday.  She presents to the ED because she is concerned about her ongoing high glucose levels and states she cannot bring him down.  Reports ongoing increased thirst, dry mouth, increased urination.  No recent  infections.  Denies cough, vomiting or diarrhea.  Denies dysuria.  Patient expressing frustration as she feels like she has been following her doctor's recommendations, medicines and changing her diet.  Not eating more raw vegetables.  States her primary care doctor and endocrinologist do not want to step on each other's toes and now she does not know what to do.    HPI     Past Medical History:  Diagnosis Date  . Anemia    WITH PREGNANCY  . Arm fracture AGE 62  . Asthma 08/2011   ALBUTEROL  INHALER PRN  . Diabetes mellitus without complication ([place])   . Headache(784.0)    MIGRAINES RESOLVED  . Hypertension 2001  . Infection    UTI X 1  . Pregnancy induced hypertension   . Preterm delivery, delivered 04/10/2012  . Preterm labor 1995  . Trichimoniasis   . Yeast infection     Patient Active Problem List   Diagnosis Date Noted  . Preterm labor 03/23/2012  . Flu 03/04/2012  . Asthma complicating pregnancy, antepartum 02/22/2012  . Smoker 02/22/2012  . 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 WISDOM TOOTH EXTRACTION       OB History    Gravida  5   Para  5   Term  4   Preterm  1   AB      Living  4     SAB  TAB      Ectopic      Multiple      Live Births  4           Family History  Problem Relation Age of Onset  . Diabetes Mother   . Hypertension Mother   . Drug abuse Mother   . Heart disease Maternal Grandfather   . Diabetes Maternal Grandfather   . Hypertension Maternal Grandfather   . Stroke Paternal Grandfather   . Hypertension Paternal Grandfather   . Cancer Paternal Grandfather   . Hypertension Sister   . Diabetes Paternal Aunt   . Cancer Paternal Grandmother     Social History   Tobacco Use  . Smoking status: Current Every Day Smoker    Packs/day: 0.50    Years: 20.00    Pack years: 10.00    Types: Cigarettes  . Smokeless tobacco: Never Used  Substance Use Topics  .  Alcohol use: No    Comment: occ  . Drug use: No    Home Medications Prior to Admission medications   Medication Sig Start Date End Date Taking? Authorizing Provider  albuterol (PROVENTIL HFA;VENTOLIN HFA) 108 (90 BASE) MCG/ACT inhaler Inhale 2 puffs into the lungs every 6 (six) hours as needed. Shortness of breath 02/04/12  Yes Donnel Saxon, CNM  amLODipine (NORVASC) 5 MG tablet Take by mouth. 07/02/19  Yes [provider]  cabergoline (DOSTINEX) 0.5 MG tablet Take by mouth. 11/16/19  Yes [provider]  hydrochlorothiazide (HYDRODIURIL) 25 MG tablet Take 1 tablet (25 mg total) by mouth daily. 10/04/17  Yes Carmin Muskrat, MD  Insulin Degludec (TRESIBA ) Inject into the skin.   Yes [provider]  METFORMIN HCL PO Take by mouth.   Yes [provider]  clindamycin (CLEOCIN) 300 MG capsule Take 1 capsule (300 mg total) by mouth 3 (three) times daily. 02/21/14   Gregor Hams, MD  traMADol (ULTRAM) 50 MG tablet Take 1 tablet (50 mg total) by mouth every 6 (six) hours as needed. 10/04/17   Carmin Muskrat, MD    Allergies    Penicillins  Review of Systems   Review of Systems  Endocrine: Positive for polydipsia and polyuria.  Genitourinary: Positive for frequency.  All other systems reviewed and are negative.   Physical Exam Updated Vital Signs BP 115/85 (BP Location: Right Arm)   Pulse 66   Temp 98.2 F (36.8 C) (Oral)   Resp 18   Ht 5\' 5"  (1.651 m)   Wt 86.2 kg   SpO2 98%   BMI 31.62 kg/m   Physical Exam Vitals and nursing note reviewed.  Constitutional:      General: She is not in acute distress.    Appearance: She is well-developed.     Comments: NAD.  HENT:     Head: Normocephalic and atraumatic.     Right Ear: External ear normal.     Left Ear: External ear normal.     Nose: Nose normal.     Mouth/Throat:     Comments: MMM Eyes:     General: No scleral icterus.    Conjunctiva/sclera: Conjunctivae normal.  Cardiovascular:      Rate and Rhythm: Normal rate and regular rhythm.     Heart sounds: Normal heart sounds.  Pulmonary:     Effort: Pulmonary effort is normal.     Breath sounds: Normal breath sounds.  Abdominal:     Palpations: Abdomen is soft.     Tenderness: There is  no abdominal tenderness.  Musculoskeletal:        General: Normal range of motion.     Cervical back: Normal range of motion and neck supple.  Skin:    General: Skin is warm and dry.     Capillary Refill: Capillary refill takes less than 2 seconds.  Neurological:     Mental Status: She is alert and oriented to person, place, and time.  Psychiatric:        Behavior: Behavior normal.        Thought Content: Thought content normal.        Judgment: Judgment normal.     ED Results / Procedures / Treatments   Labs (all labs ordered are listed, but only abnormal results are displayed) Labs Reviewed  URINALYSIS, ROUTINE W REFLEX MICROSCOPIC - Abnormal; Notable for the following components:      Result Value   Glucose, UA >=500 (*)    Hgb urine dipstick MODERATE (*)    Ketones, ur 15 (*)    Protein, ur 100 (*)    All other components within normal limits  URINALYSIS, MICROSCOPIC (REFLEX) - Abnormal; Notable for the following components:   Bacteria, UA FEW (*)    All other components within normal limits  BASIC METABOLIC PANEL - Abnormal; Notable for the following components:   Sodium 129 (*)    Chloride 94 (*)    Glucose, Bld 404 (*)    All other components within normal limits  CBG MONITORING, ED - Abnormal; Notable for the following components:   Glucose-Capillary 417 (*)    All other components within normal limits  CBG MONITORING, ED - Abnormal; Notable for the following components:   Glucose-Capillary 293 (*)    All other components within normal limits  CBC  PREGNANCY, URINE  CBG MONITORING, ED    EKG None  Radiology No results found.  Procedures Procedures (including critical care time)  Medications  Ordered in ED Medications  insulin regular (NOVOLIN R) 100 units/mL injection 8 Units (has no administration in time range)  sodium chloride 0.9 % bolus 1,000 mL (1,000 mLs Intravenous New Bag/Given 12/27/19 1333)  insulin aspart (novoLOG) 100 UNIT/ML injection (8 Units  Given 12/27/19 1334)  living well with diabetes book MISC (1 each Does not apply Given 12/27/19 1355)    ED Course  I have reviewed the triage vital signs and the nursing notes.  Pertinent labs & imaging results that were available during my care of the patient were reviewed by me and considered in my medical decision making (see chart for details).    MDM Rules/Calculators/A&P                            This patient complains of elevated glucose levels despite adherence to insulin and Metformin, diet modifications.  I obtained additional history from triage, nursing notes and review of medical chart.  Previous medical records available, nursing notes reviewed to obtain more history and assist with MDM.  Seen by PCP 8/5.  A1c 9% on 8/5.  Patient saw endocrinologist 6 days ago who started her on insulin, but records of this encounter are not available.  Chief complain involves an extensive number of treatment options and is a complaint that carries with it a high risk of complications and morbidity and mortality.    The differential diagnosis includes hyperglycemia, DKA, HHS.  Laboratory studies and imaging ordered in triage RN and I ordered additional laboratory  and imaging studies including .  I have personally visualized and interpreted labs and imaging.    ER work up reveals hyperglycemia glucose 404, pseudohyponatremia 129, chloride 94.  Normal anion gap, creatinine.  Urinalysis shows glucosuria, 15 ketones and 100 protein but no infection.  Clinical work-up suggestive of uncomplicated hyperglycemia without evidence of diabetic crisis.  No vomiting or abdominal pain.  Not significantly dehydrated.  I ordered  medications including IV fluid, insulin.  I have ordered continuous cardiac monitoring, pulse ox.  Will plan for serial re-examinations. Close monitoring.   I have re-evaluated the patient.    CBG now 293.  Appropriate for discharge.    Discussed with her realistic expectations of diabetes being a lifelong medical condition.  It will take some time and medication adjustment for her to have good control of her glucose levels at home.  Encouraged continued  medication adherence and diet modification.  Will defer any insulin/medication adjustments here and refer back to PCP/endocrinology.  Has appointment tomorrow at 8:45 am. Strict return precautions given.  Patient is comfortable with the plan.    Consults made in the ED: none Critical interventions in the ED: none  Final Clinical Impression(s) / ED Diagnoses Final diagnoses:  Hyperglycemia    Rx / DC Orders ED Discharge Orders    None       Kinnie Feil, PA-C 12/27/19 1524    Gareth Morgan, MD 12/28/19 2359

## 2020-01-03 ENCOUNTER — Other Ambulatory Visit: Payer: Self-pay

## 2020-01-03 ENCOUNTER — Ambulatory Visit
Admission: RE | Admit: 2020-01-03 | Discharge: 2020-01-03 | Disposition: A | Discharge status: Home / Self Care | Payer: 59 | Source: Ambulatory Visit | Attending: Internal Medicine | Admitting: Internal Medicine

## 2020-01-03 DIAGNOSIS — D352 Benign neoplasm of pituitary gland: Secondary | ICD-10-CM

## 2020-01-03 DIAGNOSIS — N912 Amenorrhea, unspecified: Secondary | ICD-10-CM

## 2020-01-03 MED ORDER — GADOBENATE DIMEGLUMINE 529 MG/ML IV SOLN
9.0000 mL | Freq: Once | INTRAVENOUS | Status: AC | PRN
  Administered 2020-01-03: 9 mL via INTRAVENOUS

## 2020-09-27 IMAGING — MR MR HEAD WO/W CM
15 of 18 series · 38 of 48 positions shown · IV contrast (9ml Multihance)
Comparison: None.

CLINICAL DATA: Elevated prolactin.

EXAM:
MRI HEAD WITHOUT AND WITH CONTRAST
TECHNIQUE: Multiplanar, multiecho pulse sequences of the brain and surrounding
structures were obtained without and with intravenous contrast.
CONTRAST:  9mL MULTIHANCE GADOBENATE DIMEGLUMINE 529 MG/ML IV SOLN

[Series 2: T1 · sagittal · 5.0mm · 0.45mm/px · 3 of 22 slices shown]
[im 1/22]
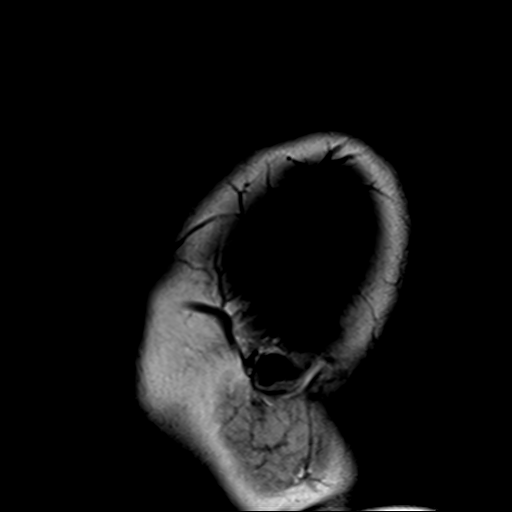
[im 11/22]
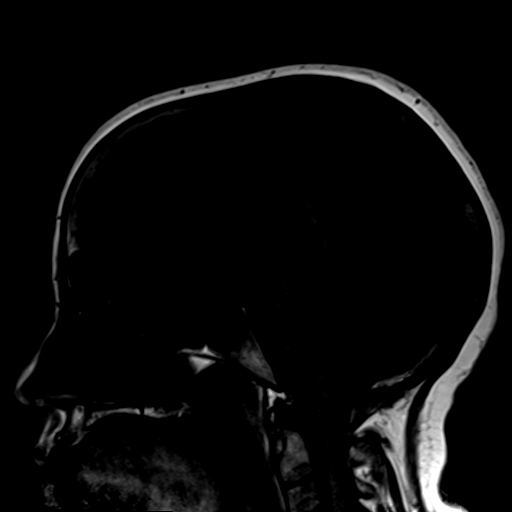
[im 22/22]
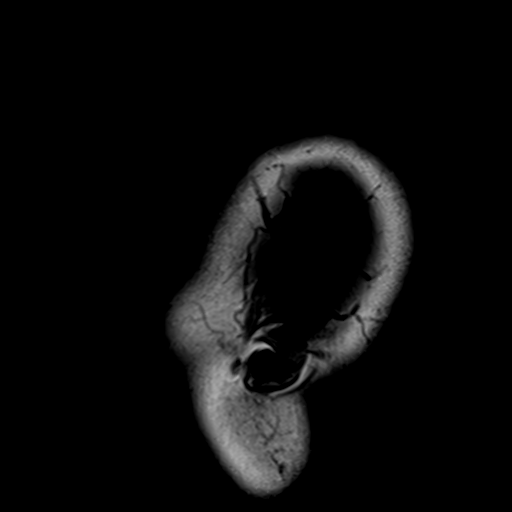

[Series 3: DWI · axial · 3.0mm · 1.80mm/px · z∈[-59,+88]mm · 11 of 100 slices shown]
[im 1/100]
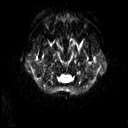
[im 10/100]
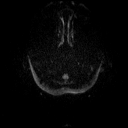
[im 20/100]
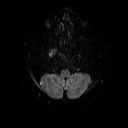
[im 30/100]
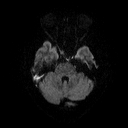
[im 40/100]
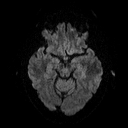
[im 50/100]
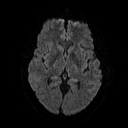
[im 60/100]
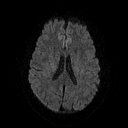
[im 70/100]
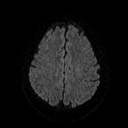
[im 80/100]
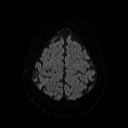
[im 90/100]
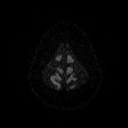
[im 100/100]
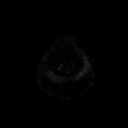

[Series 4: dwi_adc · axial · 3.0mm · 1.80mm/px · z∈[-59,+88]mm · 6 of 50 slices shown]
[im 1/50]
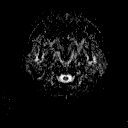
[im 10/50]
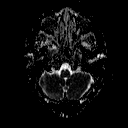
[im 20/50]
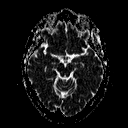
[im 30/50]
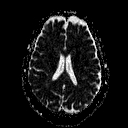
[im 40/50]
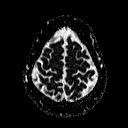
[im 50/50]
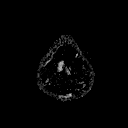

[Series 6: FLAIR · axial · 3.0mm · 0.45mm/px · z∈[-61,+74]mm · 3 of 30 slices shown]
[im 1/30]
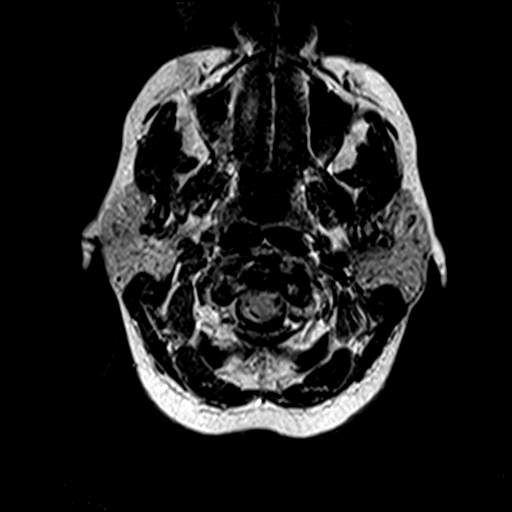
[im 15/30]
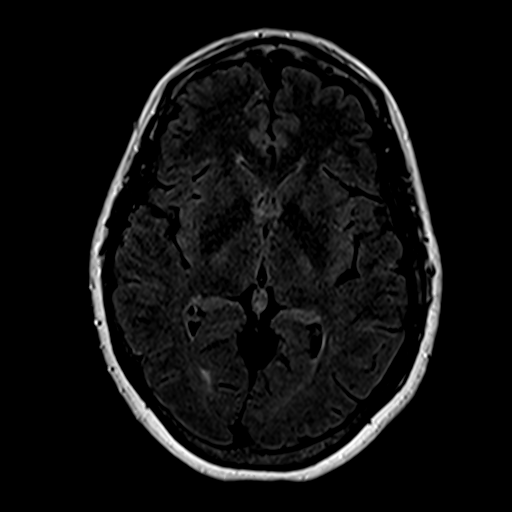
[im 30/30]
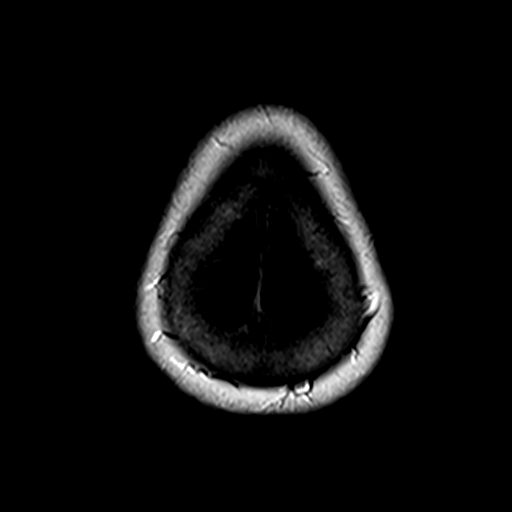

[Series 7: T2 · axial · 5.0mm · 0.36mm/px · z∈[-60,+76]mm · 2 of 22 slices shown]
[im 1/22]
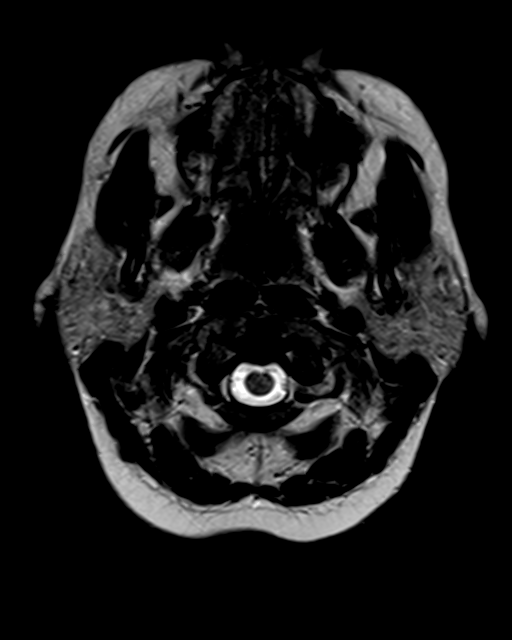
[im 22/22]
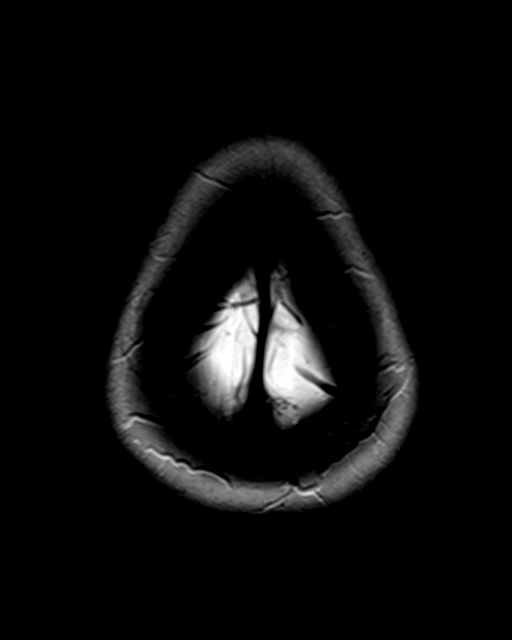

[Series 9: swi_images · axial · 4.0mm · 0.94mm/px · z∈[-63,+76]mm · 4 of 36 slices shown]
[im 1/36]
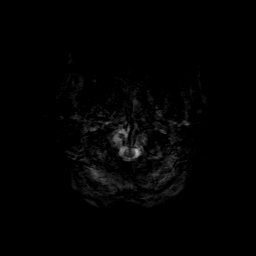
[im 12/36]
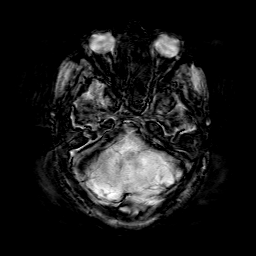
[im 24/36]
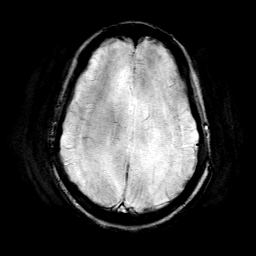
[im 36/36]
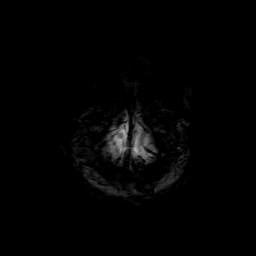

[Series 10: sag 3mm · sagittal · 3.0mm · 0.33mm/px · 1 of 11 slices shown]
[im 1/11]
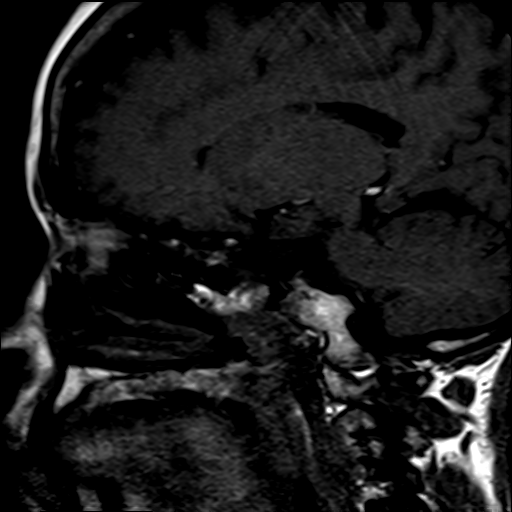

[Series 11: cor 3mm · coronal · 3.0mm · 0.33mm/px · 1 of 11 slices shown]
[im 1/11]
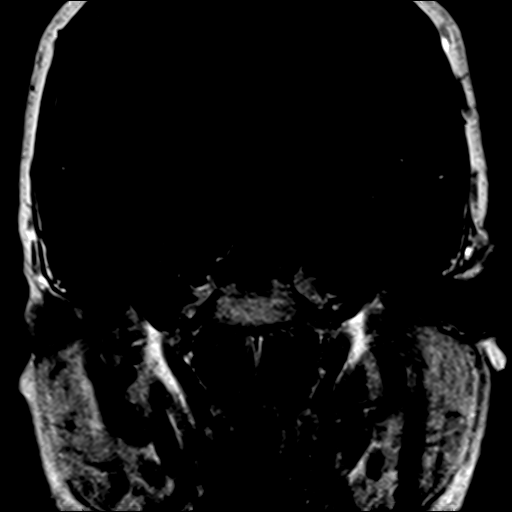

[Series 12: pre cor dynamic · coronal · non-contrast · 3.0mm · 0.35mm/px · 1 of 7 slices shown]
[im 1/7]
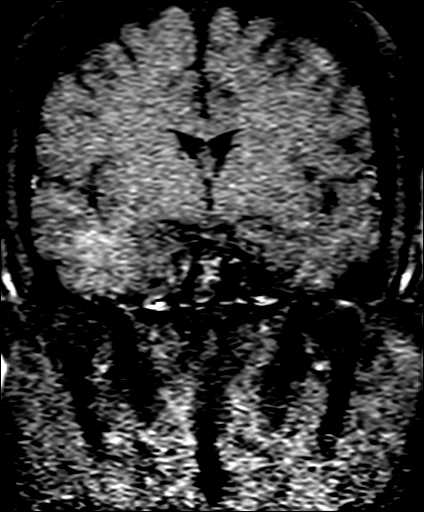

[Series 13: post fs cor · coronal · 3.0mm · 0.35mm/px · 1 of 7 slices shown (1 of 6)]
[im 1/7]
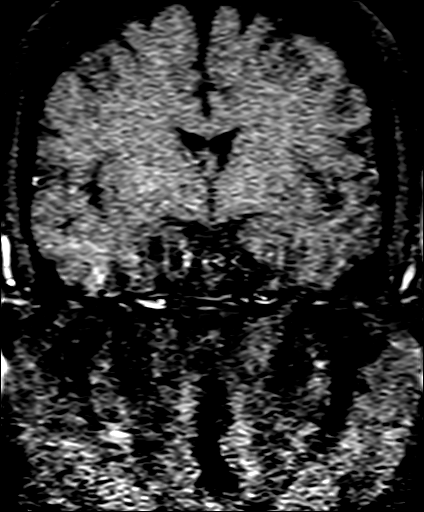

[Series 14: post fs cor · coronal · 3.0mm · 0.35mm/px · 1 of 7 slices shown (2 of 6)]
[im 1/7]
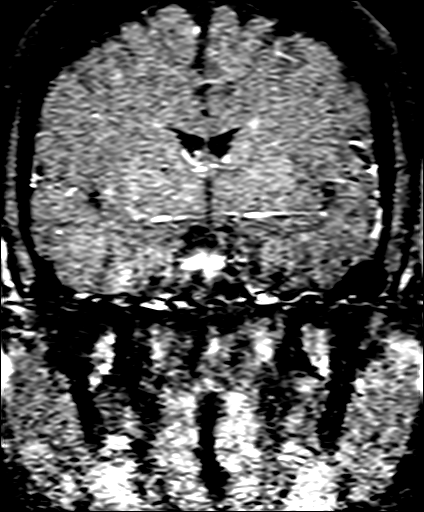

[Series 15: post fs cor · coronal · 3.0mm · 0.35mm/px · 1 of 7 slices shown (3 of 6)]
[im 1/7]
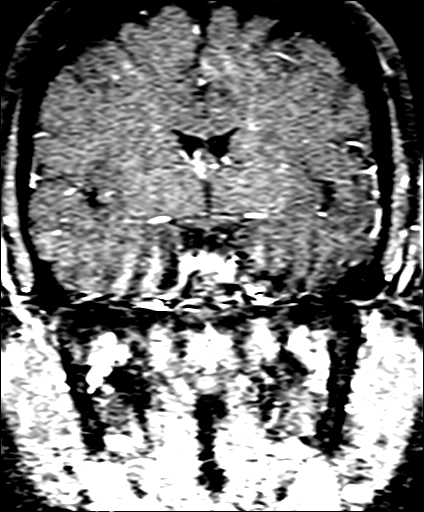

[Series 16: post fs cor · coronal · 3.0mm · 0.35mm/px · 1 of 7 slices shown (4 of 6)]
[im 1/7]
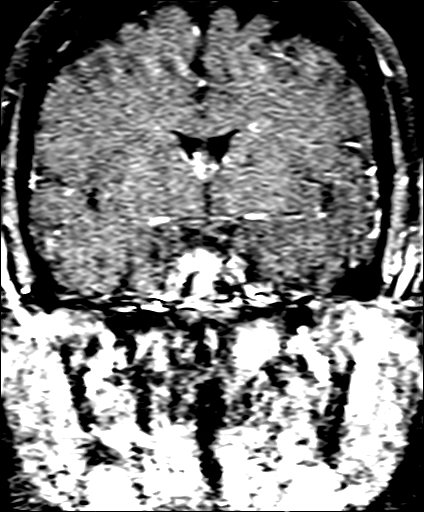

[Series 17: post fs cor · coronal · 3.0mm · 0.35mm/px · 1 of 7 slices shown (5 of 6)]
[im 1/7]
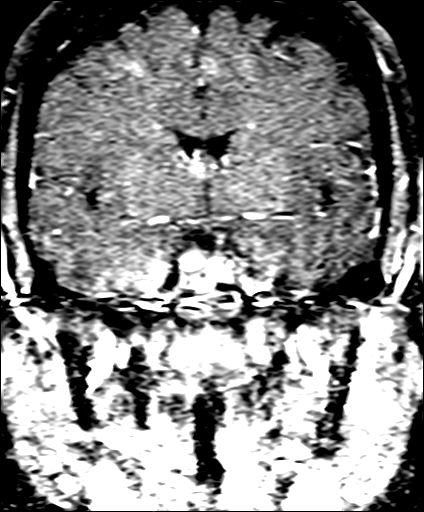

[Series 18: post fs cor · coronal · 3.0mm · 0.35mm/px · 1 of 7 slices shown (6 of 6)]
[im 1/7]
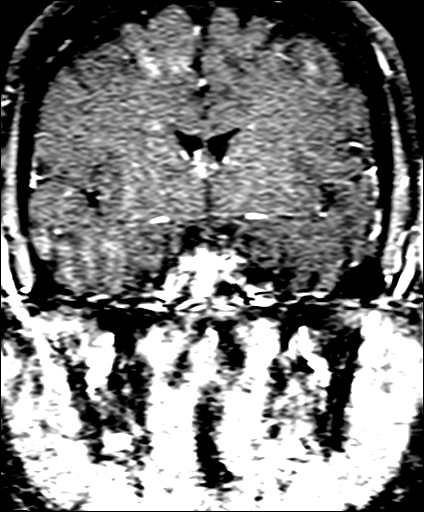

[38 of 48 positions shown; findings below may reference images not displayed]

FINDINGS: Brain: Partially empty sella with pituitary flattening along the
mildly expanded sella. On coronal postcontrast imaging there is a 4
mm hypoenhancing nodular area in the right aspect of the pituitary
gland, see series 20. There is extension to the lateral inter
carotid line, and the but not beyond it. Clear suprasellar cistern.
No infundibular thickening.

No incidental infarct, hemorrhage, hydrocephalus, or collection.

Vascular: Normal flow voids and vascular enhancement

Skull and upper cervical spine: Normal marrow signal

Sinuses/Orbits: Partial sphenoid sinus opacification, likely by
frothy material. The left sphenoid sinus is dominant and better
aerated.
IMPRESSION: 1. Suspect a 4 mm microadenoma in the right-sided pituitary, closely
associated with the cavernous sinus.
2. Partially empty sella with diffuse flattening of the pituitary
gland.

## 2021-05-31 ENCOUNTER — Emergency Department (HOSPITAL_COMMUNITY)
Admission: EM | Admit: 2021-05-31 | Discharge: 2021-06-01 | Disposition: A | Discharge status: Home / Self Care | Payer: BLUE CROSS/BLUE SHIELD | Attending: Emergency Medicine | Admitting: Emergency Medicine

## 2021-05-31 ENCOUNTER — Other Ambulatory Visit: Payer: Self-pay

## 2021-05-31 ENCOUNTER — Encounter (HOSPITAL_COMMUNITY): Payer: Self-pay

## 2021-05-31 DIAGNOSIS — M79604 Pain in right leg: Secondary | ICD-10-CM | POA: Insufficient documentation

## 2021-05-31 DIAGNOSIS — R739 Hyperglycemia, unspecified: Secondary | ICD-10-CM | POA: Diagnosis not present

## 2021-05-31 DIAGNOSIS — M79605 Pain in left leg: Secondary | ICD-10-CM | POA: Diagnosis not present

## 2021-05-31 DIAGNOSIS — Z5321 Procedure and treatment not carried out due to patient leaving prior to being seen by health care provider: Secondary | ICD-10-CM | POA: Diagnosis not present

## 2021-05-31 LAB — CBC WITH DIFFERENTIAL/PLATELET
Abs Immature Granulocytes: 0.06 10*3/uL (ref 0.00–0.07)
Basophils Absolute: 0.1 10*3/uL (ref 0.0–0.1)
Basophils Relative: 1 %
Eosinophils Absolute: 0.2 10*3/uL (ref 0.0–0.5)
Eosinophils Relative: 1 %
HCT: 39.7 % (ref 36.0–46.0)
Hemoglobin: 13 g/dL (ref 12.0–15.0)
Immature Granulocytes: 0 %
Lymphocytes Relative: 38 %
Lymphs Abs: 6.3 10*3/uL — ABNORMAL HIGH (ref 0.7–4.0)
MCH: 27.9 pg (ref 26.0–34.0)
MCHC: 32.7 g/dL (ref 30.0–36.0)
MCV: 85.2 fL (ref 80.0–100.0)
Monocytes Absolute: 1.3 10*3/uL — ABNORMAL HIGH (ref 0.1–1.0)
Monocytes Relative: 8 %
Neutro Abs: 8.8 10*3/uL — ABNORMAL HIGH (ref 1.7–7.7)
Neutrophils Relative %: 52 %
Platelets: 490 10*3/uL — ABNORMAL HIGH (ref 150–400)
RBC: 4.66 MIL/uL (ref 3.87–5.11)
RDW: 13.6 % (ref 11.5–15.5)
WBC: 16.8 10*3/uL — ABNORMAL HIGH (ref 4.0–10.5)
nRBC: 0 % (ref 0.0–0.2)

## 2021-05-31 LAB — COMPREHENSIVE METABOLIC PANEL
ALT: 25 U/L (ref 0–44)
AST: 19 U/L (ref 15–41)
Albumin: 4 g/dL (ref 3.5–5.0)
Alkaline Phosphatase: 159 U/L — ABNORMAL HIGH (ref 38–126)
Anion gap: 11 (ref 5–15)
BUN: 18 mg/dL (ref 6–20)
CO2: 24 mmol/L (ref 22–32)
Calcium: 9.7 mg/dL (ref 8.9–10.3)
Chloride: 99 mmol/L (ref 98–111)
Creatinine, Ser: 0.9 mg/dL (ref 0.44–1.00)
GFR, Estimated: >60 mL/min (ref >60)
Glucose, Bld: 285 mg/dL — ABNORMAL HIGH (ref 70–99)
Potassium: 3.2 mmol/L — ABNORMAL LOW (ref 3.5–5.1)
Sodium: 134 mmol/L — ABNORMAL LOW (ref 135–145)
Total Bilirubin: 0.4 mg/dL (ref 0.3–1.2)
Total Protein: 8.5 g/dL — ABNORMAL HIGH (ref 6.5–8.1)

## 2021-05-31 LAB — CBG MONITORING, ED: Glucose-Capillary: 278 mg/dL — ABNORMAL HIGH (ref 70–99)

## 2021-05-31 NOTE — ED Notes (Signed)
Save blue in main lab 

## 2021-05-31 NOTE — ED Triage Notes (Signed)
Pt states that her blood sugar was elevated a few hours ago (500). Pt states that she took her prescribed insulin. Pt also complains of bilateral leg cramping that starts in her feet and shoots up to both legs.

## 2021-06-01 ENCOUNTER — Encounter (HOSPITAL_BASED_OUTPATIENT_CLINIC_OR_DEPARTMENT_OTHER): Payer: Self-pay | Admitting: Emergency Medicine

## 2021-06-01 ENCOUNTER — Other Ambulatory Visit: Payer: Self-pay

## 2021-06-01 ENCOUNTER — Emergency Department (HOSPITAL_BASED_OUTPATIENT_CLINIC_OR_DEPARTMENT_OTHER)
Admission: EM | Admit: 2021-06-01 | Discharge: 2021-06-01 | Disposition: A | Discharge status: Home / Self Care | Payer: BLUE CROSS/BLUE SHIELD | Attending: Emergency Medicine | Admitting: Emergency Medicine

## 2021-06-01 DIAGNOSIS — R252 Cramp and spasm: Secondary | ICD-10-CM

## 2021-06-01 DIAGNOSIS — M79605 Pain in left leg: Secondary | ICD-10-CM | POA: Diagnosis not present

## 2021-06-01 DIAGNOSIS — E1165 Type 2 diabetes mellitus with hyperglycemia: Secondary | ICD-10-CM | POA: Diagnosis not present

## 2021-06-01 DIAGNOSIS — E876 Hypokalemia: Secondary | ICD-10-CM | POA: Diagnosis not present

## 2021-06-01 DIAGNOSIS — I10 Essential (primary) hypertension: Secondary | ICD-10-CM | POA: Diagnosis not present

## 2021-06-01 DIAGNOSIS — R739 Hyperglycemia, unspecified: Secondary | ICD-10-CM

## 2021-06-01 LAB — COMPREHENSIVE METABOLIC PANEL
ALT: 23 U/L (ref 0–44)
AST: 21 U/L (ref 15–41)
Albumin: 3.8 g/dL (ref 3.5–5.0)
Alkaline Phosphatase: 150 U/L — ABNORMAL HIGH (ref 38–126)
Anion gap: 11 (ref 5–15)
BUN: 17 mg/dL (ref 6–20)
CO2: 26 mmol/L (ref 22–32)
Calcium: 9.8 mg/dL (ref 8.9–10.3)
Chloride: 100 mmol/L (ref 98–111)
Creatinine, Ser: 0.73 mg/dL (ref 0.44–1.00)
GFR, Estimated: >60 mL/min (ref >60)
Glucose, Bld: 249 mg/dL — ABNORMAL HIGH (ref 70–99)
Potassium: 3.3 mmol/L — ABNORMAL LOW (ref 3.5–5.1)
Sodium: 137 mmol/L (ref 135–145)
Total Bilirubin: 0.7 mg/dL (ref 0.3–1.2)
Total Protein: 8.1 g/dL (ref 6.5–8.1)

## 2021-06-01 LAB — CBC WITH DIFFERENTIAL/PLATELET
Abs Immature Granulocytes: 0.04 10*3/uL (ref 0.00–0.07)
Basophils Absolute: 0.1 10*3/uL (ref 0.0–0.1)
Basophils Relative: 1 %
Eosinophils Absolute: 0.1 10*3/uL (ref 0.0–0.5)
Eosinophils Relative: 1 %
HCT: 38.6 % (ref 36.0–46.0)
Hemoglobin: 12.9 g/dL (ref 12.0–15.0)
Immature Granulocytes: 0 %
Lymphocytes Relative: 28 %
Lymphs Abs: 4.3 10*3/uL — ABNORMAL HIGH (ref 0.7–4.0)
MCH: 27.9 pg (ref 26.0–34.0)
MCHC: 33.4 g/dL (ref 30.0–36.0)
MCV: 83.4 fL (ref 80.0–100.0)
Monocytes Absolute: 0.9 10*3/uL (ref 0.1–1.0)
Monocytes Relative: 6 %
Neutro Abs: 9.9 10*3/uL — ABNORMAL HIGH (ref 1.7–7.7)
Neutrophils Relative %: 64 %
Platelets: 495 10*3/uL — ABNORMAL HIGH (ref 150–400)
RBC: 4.63 MIL/uL (ref 3.87–5.11)
RDW: 13.4 % (ref 11.5–15.5)
WBC: 15.3 10*3/uL — ABNORMAL HIGH (ref 4.0–10.5)
nRBC: 0 % (ref 0.0–0.2)

## 2021-06-01 LAB — MAGNESIUM: Magnesium: 1.7 mg/dL (ref 1.7–2.4)

## 2021-06-01 LAB — CK: Total CK: 456 U/L — ABNORMAL HIGH (ref 38–234)

## 2021-06-01 MED ORDER — SODIUM CHLORIDE 0.9 % IV BOLUS
1000.0000 mL | Freq: Once | INTRAVENOUS | Status: AC
  Administered 2021-06-01: 1000 mL via INTRAVENOUS

## 2021-06-01 MED ORDER — POTASSIUM CHLORIDE CRYS ER 20 MEQ PO TBCR: 40.0000 meq | EXTENDED_RELEASE_TABLET | Freq: Every day | ORAL | 0 refills | Status: AC

## 2021-06-01 NOTE — ED Notes (Signed)
Pt has been having tingling and pain to bilateral lower extremities since yesterday.  Pt states her blood sugar has been higher than normal.  She has been very busy with work and has not been able to keep up with monitoring her blood sugars as she should.  The pain has decreased from yesterday but she continues to have some aching in her legs.

## 2021-06-01 NOTE — ED Triage Notes (Signed)
Pt states she woke up yesterday with lower leg cramping and pain.  She went to [facility] and it had improved.  Pt states she is diabetic and her Blood sugars were high.  She did take her insulin this am.  Pt states she left [facility] yesterday LWBS and states her blood sugar at home was 500's.  Pt took humalog, decreased to 278 and this am her blood sugar 244.  No history of neuropathy.

## 2021-06-01 NOTE — Discharge Instructions (Signed)
You were seen in the emergency room today with muscle cramping and high blood sugars.  I suspect that your high blood sugars are leading to some component of dehydration here.  Please drink plenty of fluids but also work to control your blood sugars.  Please follow with your primary care doctor next week to review your symptoms and ED visit.  They can make medication adjustments as needed.  I am starting you on 5 days of potassium which may also help to reduce your cramping symptoms.  Please return with any new or suddenly worsening symptoms.

## 2021-06-01 NOTE — ED Provider Notes (Signed)
Emergency Department Provider Note   I have reviewed the triage vital signs and the nursing notes.   HISTORY  Chief Complaint Leg Pain   HPI Danielle [name] is a 46 y.o. female presents to the ED with leg pain. She describes cramping pain, worse in the left leg. Went to [facility] ED due to pain and eleated blood sugar at home. Has bee off her medication in the lasat 48 hours but has refills at home. Plans to begin taking this again. Ultimately LWBS yesterday but returns today for evaluation. Denies injury. No color change or rash to the leg.     Past Medical History:  Diagnosis Date   Anemia    WITH PREGNANCY   Arm fracture AGE 1   Asthma 08/2011   ALBUTEROL  INHALER PRN   Diabetes mellitus without complication ([facility])    JQBHALPF(790.2)    MIGRAINES RESOLVED   Hypertension 2001   Infection    UTI X 1   Pregnancy induced hypertension    Preterm delivery, delivered 04/10/2012   Preterm labor 1995   Trichimoniasis    Yeast infection     Review of Systems  Constitutional: No fever/chills. Positive elevated blood sugar.  Eyes: No visual changes. ENT: No sore throat. Cardiovascular: Denies chest pain. Respiratory: Denies shortness of breath. Gastrointestinal: No abdominal pain.  No nausea, no vomiting.  No diarrhea.  No constipation. Genitourinary: Negative for dysuria. Musculoskeletal: Negative for back pain. Positive left leg pain.  Skin: Negative for rash. Neurological: Negative for headaches, focal weakness or numbness.   ____________________________________________   PHYSICAL EXAM:  VITAL SIGNS: ED Triage Vitals  Enc Vitals Group     BP 06/01/21 0935 (!) 152/97     Pulse Rate 06/01/21 0935 74     Resp 06/01/21 0935 16     Temp 06/01/21 0935 98 F (36.7 C)     Temp Source 06/01/21 0935 Oral     SpO2 06/01/21 0935 99 %     Weight 06/01/21 0936 193 lb (87.5 kg)     Height 06/01/21 0936 5\' 5"  (1.651 m)   Constitutional: Alert and oriented. Well appearing  and in no acute distress. Eyes: Conjunctivae are normal. Head: Atraumatic. Nose: No congestion/rhinnorhea. Mouth/Throat: Mucous membranes are moist.  Neck: No stridor.   Cardiovascular: Normal rate, regular rhythm. Good peripheral circulation. Grossly normal heart sounds. 2+ DP pulses bilaterally.  Respiratory: Normal respiratory effort.  No retractions. Lungs CTAB. Gastrointestinal: Soft and nontender. No distention.  Musculoskeletal: No lower extremity tenderness nor edema. No gross deformities of extremities. Neurologic:  Normal speech and language. No gross focal neurologic deficits are appreciated.  Skin:  Skin is warm, dry and intact. No rash noted.  ____________________________________________   LABS (all labs ordered are listed, but only abnormal results are displayed)  Labs Reviewed  COMPREHENSIVE METABOLIC PANEL - Abnormal; Notable for the following components:      Result Value   Potassium 3.3 (*)    Glucose, Bld 249 (*)    Alkaline Phosphatase 150 (*)    All other components within normal limits  CBC WITH DIFFERENTIAL/PLATELET - Abnormal; Notable for the following components:   WBC 15.3 (*)    Platelets 495 (*)    Neutro Abs 9.9 (*)    Lymphs Abs 4.3 (*)    All other components within normal limits  CK - Abnormal; Notable for the following components:   Total CK 456 (*)    All other components within normal limits  MAGNESIUM    ____________________________________________   PROCEDURES  Procedure(s) performed:   Procedures  None ____________________________________________   INITIAL IMPRESSION / ASSESSMENT AND PLAN / ED COURSE  Pertinent labs & imaging results that were available during my care of the patient were reviewed by me and considered in my medical decision making (see chart for details).   This patient is Presenting for Evaluation of hyperglycemia, which does require a range of treatment options, and is a complaint that involves a high risk  of morbidity and mortality.  The Differential Diagnoses include limb ischemia, injury, fracture, DKA.  Critical Interventions- IVF   Medications  sodium chloride 0.9 % bolus 1,000 mL (0 mLs Intravenous Stopped 06/01/21 1222)    Reassessment after intervention:  CBG down-trending with IVF.   I decided to review pertinent External Data, and in summary patient seen in triage and MSE at [facility] last night.   Clinical Laboratory Tests Ordered, included CMP with hyperglycemia but no DKA. No AKI. CK mildly elevated but not consistent with rhabdo.   Radiologic Tests: Considered x-ray and/or US imaging but patient with equal pulses, no leg swelling, and no injury. Defer imaging for now.   Cardiac Monitor Tracing which shows sinus bradycardia. No ectopy.    Social Determinants of Health Risk patient is a smoker.    Medical Decision Making and Reevaluation with update and discussion with patient.  Patient CK is very mildly elevated.  She has some hemoconcentration and as well as hyperglycemia.  Suspect that her ongoing hyperglycemia is leading to dehydration through polyuria and causing cramping in her muscles.  Her potassium is slightly low.  She is continuing to work with her PCP on better controlling her blood sugars.  She is feeling much better after IV fluids here.  Plan for short burst of potassium and continued glucose control at home.  CK is not in the range where IV concern for rhabdo.  She is not on a statin or other medication that I would suspect of causing the symptoms.   Disposition: discharge   ____________________________________________  FINAL CLINICAL IMPRESSION(S) / ED DIAGNOSES  Final diagnoses:  Hypokalemia  Hyperglycemia  Muscle cramping     NEW OUTPATIENT MEDICATIONS STARTED DURING THIS VISIT:  Discharge Medication List as of 06/01/2021 11:34 AM     START taking these medications   Details  potassium chloride SA (KLOR-CON M) 20 MEQ tablet Take 2 tablets (40 mEq  total) by mouth daily for 5 days., Starting Fri 06/01/2021, Until Wed 06/06/2021, Normal        Note:  This document was prepared using Dragon voice recognition software and may include unintentional dictation errors.  Nanda Quinton, MD, [facility] Emergency Medicine    Lofton Leon, [facility], MD 06/08/21 [phone]

## 2021-06-05 DIAGNOSIS — D352 Benign neoplasm of pituitary gland: Secondary | ICD-10-CM | POA: Diagnosis not present

## 2021-06-05 DIAGNOSIS — Z794 Long term (current) use of insulin: Secondary | ICD-10-CM | POA: Diagnosis not present

## 2021-06-05 DIAGNOSIS — E1165 Type 2 diabetes mellitus with hyperglycemia: Secondary | ICD-10-CM | POA: Diagnosis not present

## 2021-06-05 DIAGNOSIS — I10 Essential (primary) hypertension: Secondary | ICD-10-CM | POA: Diagnosis not present

## 2021-06-14 DIAGNOSIS — Z794 Long term (current) use of insulin: Secondary | ICD-10-CM | POA: Diagnosis not present

## 2021-06-14 DIAGNOSIS — R42 Dizziness and giddiness: Secondary | ICD-10-CM | POA: Diagnosis not present

## 2021-06-14 DIAGNOSIS — I10 Essential (primary) hypertension: Secondary | ICD-10-CM | POA: Diagnosis not present

## 2021-06-14 DIAGNOSIS — E1165 Type 2 diabetes mellitus with hyperglycemia: Secondary | ICD-10-CM | POA: Diagnosis not present

## 2021-07-26 DIAGNOSIS — Z794 Long term (current) use of insulin: Secondary | ICD-10-CM | POA: Diagnosis not present

## 2021-07-26 DIAGNOSIS — I10 Essential (primary) hypertension: Secondary | ICD-10-CM | POA: Diagnosis not present

## 2021-07-26 DIAGNOSIS — E1165 Type 2 diabetes mellitus with hyperglycemia: Secondary | ICD-10-CM | POA: Diagnosis not present

## 2021-08-23 DIAGNOSIS — Z794 Long term (current) use of insulin: Secondary | ICD-10-CM | POA: Diagnosis not present

## 2021-08-23 DIAGNOSIS — I10 Essential (primary) hypertension: Secondary | ICD-10-CM | POA: Diagnosis not present

## 2021-08-23 DIAGNOSIS — E1165 Type 2 diabetes mellitus with hyperglycemia: Secondary | ICD-10-CM | POA: Diagnosis not present

## 2021-08-23 DIAGNOSIS — G5600 Carpal tunnel syndrome, unspecified upper limb: Secondary | ICD-10-CM | POA: Diagnosis not present

## 2021-08-23 DIAGNOSIS — Z566 Other physical and mental strain related to work: Secondary | ICD-10-CM | POA: Diagnosis not present

## 2021-11-15 DIAGNOSIS — Z794 Long term (current) use of insulin: Secondary | ICD-10-CM | POA: Diagnosis not present

## 2021-11-15 DIAGNOSIS — I10 Essential (primary) hypertension: Secondary | ICD-10-CM | POA: Diagnosis not present

## 2021-11-15 DIAGNOSIS — Z1211 Encounter for screening for malignant neoplasm of colon: Secondary | ICD-10-CM | POA: Diagnosis not present

## 2021-11-15 DIAGNOSIS — E1165 Type 2 diabetes mellitus with hyperglycemia: Secondary | ICD-10-CM | POA: Diagnosis not present

## 2021-11-15 DIAGNOSIS — Z1231 Encounter for screening mammogram for malignant neoplasm of breast: Secondary | ICD-10-CM | POA: Diagnosis not present

## 2021-11-22 ENCOUNTER — Emergency Department (HOSPITAL_BASED_OUTPATIENT_CLINIC_OR_DEPARTMENT_OTHER)
Admission: EM | Admit: 2021-11-22 | Discharge: 2021-11-22 | Disposition: A | Discharge status: Home / Self Care | Payer: Federal, State, Local not specified - PPO | Attending: Emergency Medicine | Admitting: Emergency Medicine

## 2021-11-22 ENCOUNTER — Emergency Department (HOSPITAL_BASED_OUTPATIENT_CLINIC_OR_DEPARTMENT_OTHER): Payer: Federal, State, Local not specified - PPO

## 2021-11-22 ENCOUNTER — Other Ambulatory Visit: Payer: Self-pay

## 2021-11-22 ENCOUNTER — Encounter (HOSPITAL_BASED_OUTPATIENT_CLINIC_OR_DEPARTMENT_OTHER): Payer: Self-pay | Admitting: Emergency Medicine

## 2021-11-22 DIAGNOSIS — R1012 Left upper quadrant pain: Secondary | ICD-10-CM | POA: Diagnosis not present

## 2021-11-22 DIAGNOSIS — I10 Essential (primary) hypertension: Secondary | ICD-10-CM | POA: Insufficient documentation

## 2021-11-22 DIAGNOSIS — D72829 Elevated white blood cell count, unspecified: Secondary | ICD-10-CM | POA: Insufficient documentation

## 2021-11-22 DIAGNOSIS — R748 Abnormal levels of other serum enzymes: Secondary | ICD-10-CM | POA: Insufficient documentation

## 2021-11-22 DIAGNOSIS — J45909 Unspecified asthma, uncomplicated: Secondary | ICD-10-CM | POA: Insufficient documentation

## 2021-11-22 DIAGNOSIS — Z7984 Long term (current) use of oral hypoglycemic drugs: Secondary | ICD-10-CM | POA: Insufficient documentation

## 2021-11-22 DIAGNOSIS — E119 Type 2 diabetes mellitus without complications: Secondary | ICD-10-CM | POA: Diagnosis not present

## 2021-11-22 DIAGNOSIS — R1013 Epigastric pain: Secondary | ICD-10-CM | POA: Insufficient documentation

## 2021-11-22 DIAGNOSIS — F17200 Nicotine dependence, unspecified, uncomplicated: Secondary | ICD-10-CM | POA: Insufficient documentation

## 2021-11-22 DIAGNOSIS — R1010 Upper abdominal pain, unspecified: Secondary | ICD-10-CM | POA: Diagnosis not present

## 2021-11-22 DIAGNOSIS — R1011 Right upper quadrant pain: Secondary | ICD-10-CM | POA: Insufficient documentation

## 2021-11-22 LAB — CBC WITH DIFFERENTIAL/PLATELET
Abs Immature Granulocytes: 0.04 10*3/uL (ref 0.00–0.07)
Basophils Absolute: 0.1 10*3/uL (ref 0.0–0.1)
Basophils Relative: 1 %
Eosinophils Absolute: 0.2 10*3/uL (ref 0.0–0.5)
Eosinophils Relative: 2 %
HCT: 37.7 % (ref 36.0–46.0)
Hemoglobin: 12.2 g/dL (ref 12.0–15.0)
Immature Granulocytes: 0 %
Lymphocytes Relative: 21 %
Lymphs Abs: 3.1 10*3/uL (ref 0.7–4.0)
MCH: 27.5 pg (ref 26.0–34.0)
MCHC: 32.4 g/dL (ref 30.0–36.0)
MCV: 85.1 fL (ref 80.0–100.0)
Monocytes Absolute: 0.9 10*3/uL (ref 0.1–1.0)
Monocytes Relative: 6 %
Neutro Abs: 10.2 10*3/uL — ABNORMAL HIGH (ref 1.7–7.7)
Neutrophils Relative %: 70 %
Platelets: 446 10*3/uL — ABNORMAL HIGH (ref 150–400)
RBC: 4.43 MIL/uL (ref 3.87–5.11)
RDW: 14.7 % (ref 11.5–15.5)
WBC: 14.6 10*3/uL — ABNORMAL HIGH (ref 4.0–10.5)
nRBC: 0 % (ref 0.0–0.2)

## 2021-11-22 LAB — COMPREHENSIVE METABOLIC PANEL
ALT: 17 U/L (ref 0–44)
AST: 18 U/L (ref 15–41)
Albumin: 4.3 g/dL (ref 3.5–5.0)
Alkaline Phosphatase: 148 U/L — ABNORMAL HIGH (ref 38–126)
Anion gap: 7 (ref 5–15)
BUN: 9 mg/dL (ref 6–20)
CO2: 26 mmol/L (ref 22–32)
Calcium: 9.8 mg/dL (ref 8.9–10.3)
Chloride: 105 mmol/L (ref 98–111)
Creatinine, Ser: 0.72 mg/dL (ref 0.44–1.00)
GFR, Estimated: >60 mL/min (ref >60)
Glucose, Bld: 110 mg/dL — ABNORMAL HIGH (ref 70–99)
Potassium: 3.8 mmol/L (ref 3.5–5.1)
Sodium: 138 mmol/L (ref 135–145)
Total Bilirubin: 0.4 mg/dL (ref 0.3–1.2)
Total Protein: 7.5 g/dL (ref 6.5–8.1)

## 2021-11-22 LAB — LIPASE, BLOOD: Lipase: 26 U/L (ref 11–51)

## 2021-11-22 MED ORDER — OMEPRAZOLE 20 MG PO CPDR: 20.0000 mg | DELAYED_RELEASE_CAPSULE | Freq: Every day | ORAL | 0 refills | Status: AC

## 2021-11-22 MED ORDER — LIDOCAINE VISCOUS HCL 2 % MT SOLN
15.0000 mL | Freq: Once | OROMUCOSAL | Status: AC
  Administered 2021-11-22: 15 mL via ORAL
  Filled 2021-11-22: qty 15

## 2021-11-22 MED ORDER — ALUM & MAG HYDROXIDE-SIMETH 200-200-20 MG/5ML PO SUSP
30.0000 mL | Freq: Once | ORAL | Status: AC
Start: 2021-11-22 — End: 2021-11-22
  Administered 2021-11-22: 30 mL via ORAL
  Filled 2021-11-22: qty 30

## 2021-11-22 MED ORDER — SODIUM CHLORIDE 0.9 % IV BOLUS
1000.0000 mL | Freq: Once | INTRAVENOUS | Status: AC
  Administered 2021-11-22: 1000 mL via INTRAVENOUS

## 2021-11-22 NOTE — ED Provider Notes (Signed)
[facility] EMERGENCY DEPARTMENT Provider Note   CSN: [id number] Arrival date & time: 11/22/21  1005     History  Chief Complaint  Patient presents with   Abdominal Pain    Danielle [name] is a 46 y.o. female.  46 year old female with past medical history of asthma, hypertension, diabetes presents with complaint of upper abdominal pain.  States that she has had intermittent discomfort for the past few weeks, worse since eating a grilled chicken salad with mayonnaise, lettuce, tomato yesterday.  Denies associated nausea, vomiting, changes in bowel or bladder habits, chest pain or shortness of breath.  No prior abdominal surgeries.  Daily smoker, denies regular alcohol use.  Has not taken anything for pain, denies regular NSAID use.       Home Medications Prior to Admission medications   Medication Sig Start Date End Date Taking? Authorizing Provider  omeprazole (PRILOSEC) 20 MG capsule Take 1 capsule (20 mg total) by mouth daily. 11/22/21  Yes Tacy Learn, PA-C  albuterol (PROVENTIL HFA;VENTOLIN HFA) 108 (90 BASE) MCG/ACT inhaler Inhale 2 puffs into the lungs every 6 (six) hours as needed. Shortness of breath 02/04/12   Donnel Saxon, CNM  amLODipine (NORVASC) 5 MG tablet Take by mouth. 07/02/19   [provider]  cabergoline (DOSTINEX) 0.5 MG tablet Take by mouth. 11/16/19   [provider]  clindamycin (CLEOCIN) 300 MG capsule Take 1 capsule (300 mg total) by mouth 3 (three) times daily. 02/21/14   Gregor Hams, MD  hydrochlorothiazide (HYDRODIURIL) 25 MG tablet Take 1 tablet (25 mg total) by mouth daily. 10/04/17   Carmin Muskrat, MD  Insulin Degludec (TRESIBA [place]) Inject into the skin.    [provider]  METFORMIN HCL PO Take by mouth.    [provider]  potassium chloride SA (KLOR-CON M) 20 MEQ tablet Take 2 tablets (40 mEq total) by mouth daily for 5 days. 06/01/21 06/06/21  Long, Wonda Olds, MD  traMADol (ULTRAM) 50 MG tablet Take  1 tablet (50 mg total) by mouth every 6 (six) hours as needed. 10/04/17   Carmin Muskrat, MD      Allergies    Penicillins, Pravastatin, Bromocriptine, and Cabergoline    Review of Systems   Review of Systems Negative except as per HPI Physical Exam Updated Vital Signs BP (!) 148/86   Pulse (!) 58   Temp 97.9 F (36.6 C) (Oral)   Resp 16   Ht _0  (1.651 m)   Wt 82.6 kg   SpO2 99%   BMI 30.29 kg/m  Physical Exam Vitals and nursing note reviewed.  Constitutional:      General: She is not in acute distress.    Appearance: She is well-developed. She is not diaphoretic.  HENT:     Head: Normocephalic and atraumatic.  Cardiovascular:     Rate and Rhythm: Normal rate and regular rhythm.     Pulses: Normal pulses.     Heart sounds: Normal heart sounds.  Pulmonary:     Effort: Pulmonary effort is normal.     Breath sounds: Normal breath sounds.  Chest:     Chest wall: No tenderness.  Abdominal:     Palpations: Abdomen is soft.     Tenderness: There is abdominal tenderness in the right upper quadrant, epigastric area and left upper quadrant. There is no right CVA tenderness, left CVA tenderness, guarding or rebound.  Musculoskeletal:     Right lower leg: No edema.     Left  lower leg: No edema.  Skin:    General: Skin is warm and dry.     Findings: No erythema or rash.  Neurological:     Mental Status: She is alert and oriented to person, place, and time.  Psychiatric:        Behavior: Behavior normal.     ED Results / Procedures / Treatments   Labs (all labs ordered are listed, but only abnormal results are displayed) Labs Reviewed  CBC WITH DIFFERENTIAL/PLATELET - Abnormal; Notable for the following components:      Result Value   WBC 14.6 (*)    Platelets 446 (*)    Neutro Abs 10.2 (*)    All other components within normal limits  COMPREHENSIVE METABOLIC PANEL - Abnormal; Notable for the following components:   Glucose, Bld 110 (*)    Alkaline Phosphatase  148 (*)    All other components within normal limits  LIPASE, BLOOD    EKG None  Radiology US Abdomen Limited  Result Date: 11/22/2021 CLINICAL DATA:  RUQ pain EXAM: ULTRASOUND ABDOMEN LIMITED RIGHT UPPER QUADRANT COMPARISON:  None Available. FINDINGS: Gallbladder: No gallstones or wall thickening visualized. No sonographic [place] sign noted by sonographer. Common bile duct: Diameter: 2 mm, normal Liver: No focal lesion identified. Within normal limits in parenchymal echogenicity. Portal vein is patent on color Doppler imaging with normal direction of blood flow towards the liver. Other: None. IMPRESSION: Unremarkable right upper quadrant ultrasound. Electronically Signed   By: Macy Mis M.D.   On: 11/22/2021 11:51    Procedures Procedures    Medications Ordered in ED Medications  alum & mag hydroxide-simeth (MAALOX/MYLANTA) 200-200-20 MG/5ML suspension 30 mL (30 mLs Oral Given 11/22/21 1106)    And  lidocaine (XYLOCAINE) 2 % viscous mouth solution 15 mL (15 mLs Oral Given 11/22/21 1106)  sodium chloride 0.9 % bolus 1,000 mL (1,000 mLs Intravenous New Bag/Given 11/22/21 1106)    ED Course/ Medical Decision Making/ A&P                           Medical Decision Making Amount and/or Complexity of Data Reviewed Labs: ordered. Radiology: ordered.  Risk OTC drugs. Prescription drug management.   This patient presents to the ED for concern of upper abdominal pain, this involves an extensive number of treatment options, and is a complaint that carries with it a high risk of complications and morbidity.  The differential diagnosis includes but not limited to gastritis, peptic ulcer disease, cholelithiasis, acute cholecystitis, pancreatitis, medication reaction (questions if symptoms are due to her Rybelsus)   Co morbidities that complicate the patient evaluation  Asthma, hypertension, smoker   Additional history obtained:  External records from outside source obtained and  reviewed including recent visit to PCP on 11/15/2021.  Last A1c of 7.8%, reported compliance with her Rybelsus   Lab Tests:  I Ordered, and personally interpreted labs.  The pertinent results include: CBC with nonspecific leukocytosis with white count of 14.6, downtrend compared to labs obtained over the past 5 months.  Lipase is normal.  CMP with normal renal and hepatic function, alk phos mildly elevated 148, bilirubin normal.   Imaging Studies ordered:  I ordered imaging studies including [place] RUQ  I agree with the radiologist interpretation, normal   Problem List / ED Course / Critical interventions / Medication management  46 year old female with complaint of upper abdominal pain which has been intermittent for the past few weeks, worse  since eating a grilled chicken sandwich with mayonnaise on it yesterday.  On exam she has generalized upper abdominal discomfort, negative Laquilla Dault sign.  Denies associated chest pain, vomiting or changes in bowel or bladder habits.  Patient's work-up today is largely reassuring, her right upper quadrant ultrasound is normal, her LFTs and bilirubin are normal.  White count is elevated although downtrending compared to prior results on file over the past 5 months.  Patient was given a GI cocktail with resolution of her pain.  Plan is to trial omeprazole.  Given referral to GI and recommend recheck with primary care. I ordered medication including GI cocktail  for abdominal pain  Reevaluation of the patient after these medicines showed that the patient resolved I have reviewed the patients home medicines and have made adjustments as needed   Social Determinants of Health:  Has PCP   Test / Admission - Considered:  CT abd/pelvis. Workup reassuring, symptoms resolved with GI cocktail, recommend omeprazole and follow up PCP         Final Clinical Impression(s) / ED Diagnoses Final diagnoses:  Pain of upper abdomen    Rx / DC Orders ED Discharge  Orders          Ordered    omeprazole (PRILOSEC) 20 MG capsule  Daily        11/22/21 1229              Tacy Learn, PA-C 11/22/21 [place], Dan, DO 11/22/21 1500

## 2021-11-22 NOTE — Discharge Instructions (Signed)
Omeprazole daily as prescribed.  Recheck with your primary care provider.  Follow-up with GI as needed or if not improving.  Return to the emergency room for severe or concerning symptoms.

## 2021-11-22 NOTE — ED Triage Notes (Signed)
Epigastric pain since yesterday.  Pt states she has had before but this time has not gone away.  No radiation.  No sob, no N/V.  No diaphoresis.

## 2021-11-22 NOTE — ED Notes (Signed)
Pt reports relief following GI cocktail

## 2021-12-05 DIAGNOSIS — I10 Essential (primary) hypertension: Secondary | ICD-10-CM | POA: Diagnosis not present

## 2021-12-05 DIAGNOSIS — E1165 Type 2 diabetes mellitus with hyperglycemia: Secondary | ICD-10-CM | POA: Diagnosis not present

## 2021-12-05 DIAGNOSIS — E7849 Other hyperlipidemia: Secondary | ICD-10-CM | POA: Diagnosis not present

## 2021-12-05 DIAGNOSIS — Z794 Long term (current) use of insulin: Secondary | ICD-10-CM | POA: Diagnosis not present

## 2021-12-05 DIAGNOSIS — R002 Palpitations: Secondary | ICD-10-CM | POA: Diagnosis not present

## 2021-12-05 DIAGNOSIS — E669 Obesity, unspecified: Secondary | ICD-10-CM | POA: Diagnosis not present

## 2021-12-17 DIAGNOSIS — E785 Hyperlipidemia, unspecified: Secondary | ICD-10-CM | POA: Diagnosis not present

## 2021-12-17 DIAGNOSIS — Z794 Long term (current) use of insulin: Secondary | ICD-10-CM | POA: Diagnosis not present

## 2021-12-17 DIAGNOSIS — E1165 Type 2 diabetes mellitus with hyperglycemia: Secondary | ICD-10-CM | POA: Diagnosis not present

## 2021-12-17 DIAGNOSIS — I10 Essential (primary) hypertension: Secondary | ICD-10-CM | POA: Diagnosis not present

## 2022-01-09 DIAGNOSIS — E7849 Other hyperlipidemia: Secondary | ICD-10-CM | POA: Diagnosis not present

## 2022-01-09 DIAGNOSIS — I10 Essential (primary) hypertension: Secondary | ICD-10-CM | POA: Diagnosis not present

## 2022-01-09 DIAGNOSIS — R002 Palpitations: Secondary | ICD-10-CM | POA: Diagnosis not present

## 2022-01-24 DIAGNOSIS — E7849 Other hyperlipidemia: Secondary | ICD-10-CM | POA: Diagnosis not present

## 2022-01-24 DIAGNOSIS — I10 Essential (primary) hypertension: Secondary | ICD-10-CM | POA: Diagnosis not present

## 2022-01-24 DIAGNOSIS — R002 Palpitations: Secondary | ICD-10-CM | POA: Diagnosis not present

## 2022-03-04 DIAGNOSIS — Z794 Long term (current) use of insulin: Secondary | ICD-10-CM | POA: Diagnosis not present

## 2022-03-04 DIAGNOSIS — E785 Hyperlipidemia, unspecified: Secondary | ICD-10-CM | POA: Diagnosis not present

## 2022-03-04 DIAGNOSIS — I10 Essential (primary) hypertension: Secondary | ICD-10-CM | POA: Diagnosis not present

## 2022-03-04 DIAGNOSIS — R1013 Epigastric pain: Secondary | ICD-10-CM | POA: Diagnosis not present

## 2022-03-04 DIAGNOSIS — E1165 Type 2 diabetes mellitus with hyperglycemia: Secondary | ICD-10-CM | POA: Diagnosis not present

## 2022-03-05 ENCOUNTER — Encounter (HOSPITAL_BASED_OUTPATIENT_CLINIC_OR_DEPARTMENT_OTHER): Payer: Self-pay

## 2022-03-05 ENCOUNTER — Emergency Department (HOSPITAL_BASED_OUTPATIENT_CLINIC_OR_DEPARTMENT_OTHER)
Admission: EM | Admit: 2022-03-05 | Discharge: 2022-03-05 | Disposition: A | Discharge status: Home / Self Care | Payer: Federal, State, Local not specified - PPO | Attending: Emergency Medicine | Admitting: Emergency Medicine

## 2022-03-05 ENCOUNTER — Other Ambulatory Visit (HOSPITAL_BASED_OUTPATIENT_CLINIC_OR_DEPARTMENT_OTHER): Payer: Self-pay

## 2022-03-05 ENCOUNTER — Emergency Department (HOSPITAL_BASED_OUTPATIENT_CLINIC_OR_DEPARTMENT_OTHER): Payer: Federal, State, Local not specified - PPO

## 2022-03-05 DIAGNOSIS — X58XXXA Exposure to other specified factors, initial encounter: Secondary | ICD-10-CM | POA: Diagnosis not present

## 2022-03-05 DIAGNOSIS — S92255A Nondisplaced fracture of navicular [scaphoid] of left foot, initial encounter for closed fracture: Secondary | ICD-10-CM | POA: Insufficient documentation

## 2022-03-05 DIAGNOSIS — S99922A Unspecified injury of left foot, initial encounter: Secondary | ICD-10-CM | POA: Diagnosis not present

## 2022-03-05 DIAGNOSIS — M7989 Other specified soft tissue disorders: Secondary | ICD-10-CM | POA: Diagnosis not present

## 2022-03-05 DIAGNOSIS — S92252A Displaced fracture of navicular [scaphoid] of left foot, initial encounter for closed fracture: Secondary | ICD-10-CM | POA: Diagnosis not present

## 2022-03-05 MED ORDER — NAPROXEN 500 MG PO TABS
500.0000 mg | ORAL_TABLET | Freq: Two times a day (BID) | ORAL | 0 refills | Status: DC
  Filled 2022-03-05: qty 20, 10d supply, fill #0

## 2022-03-05 NOTE — Discharge Instructions (Signed)
Use the cam boot constantly while you are up but you can take it off when you are resting or bathing.  Use the crutches or a knee scooter and do not apply any weight to the area until you follow-up with sports medicine.

## 2022-03-05 NOTE — ED Provider Notes (Signed)
[facility] EMERGENCY DEPARTMENT Provider Note   CSN: [id number] Arrival date & time: 03/05/22  3335     History  Chief Complaint  Patient presents with   Ankle Pain    Danielle [name] is a 46 y.o. female.  Patient is a 47 year old female presenting today now with 5 days of medial left foot pain.  Patient did see her PCP and they told her they were concerned it may be infection and prescribed her doxycycline but she has not started the prescription yet and just feels like the pain is not getting any better.  It is better when she elevates it and the swelling goes down but then when she puts her foot on the floor the pain starts.  Today she did take ibuprofen because the pain is shooting and uncomfortable and difficult for her to walk.  She does report she is on her feet all the time and does wear fairly flat shoes.  She cannot think of any specific injury or trauma prior to the pain starting on Friday.  It is much better as long as she is not bearing weight.  No prior symptoms in the past.  She denies any ankle pain or toe pain.  She has noticed swelling in the tender area that seems to be worse at the end of the day.  No erythema, drainage.  The history is provided by the patient.  Ankle Pain      Home Medications Prior to Admission medications   Medication Sig Start Date End Date Taking? Authorizing Provider  naproxen (NAPROSYN) 500 MG tablet Take 1 tablet (500 mg total) by mouth 2 (two) times daily. 03/05/22  Yes Kenyatte Gruber, Loree Fee, MD  albuterol (PROVENTIL HFA;VENTOLIN HFA) 108 (90 BASE) MCG/ACT inhaler Inhale 2 puffs into the lungs every 6 (six) hours as needed. Shortness of breath 02/04/12   Donnel Saxon, CNM  amLODipine (NORVASC) 5 MG tablet Take by mouth. 07/02/19   [provider]  cabergoline (DOSTINEX) 0.5 MG tablet Take by mouth. 11/16/19   [provider]  clindamycin (CLEOCIN) 300 MG capsule Take 1 capsule (300 mg total) by mouth 3 (three)  times daily. 02/21/14   Gregor Hams, MD  hydrochlorothiazide (HYDRODIURIL) 25 MG tablet Take 1 tablet (25 mg total) by mouth daily. 10/04/17   Carmin Muskrat, MD  Insulin Degludec (TRESIBA [place]) Inject into the skin.    [provider]  METFORMIN HCL PO Take by mouth.    [provider]  omeprazole (PRILOSEC) 20 MG capsule Take 1 capsule (20 mg total) by mouth daily. 11/22/21   Tacy Learn, PA-C  potassium chloride SA (KLOR-CON M) 20 MEQ tablet Take 2 tablets (40 mEq total) by mouth daily for 5 days. 06/01/21 06/06/21  Long, Wonda Olds, MD  traMADol (ULTRAM) 50 MG tablet Take 1 tablet (50 mg total) by mouth every 6 (six) hours as needed. 10/04/17   Carmin Muskrat, MD      Allergies    Olmesartan, Penicillins, Pravastatin, Bromocriptine, and Cabergoline    Review of Systems   Review of Systems  Physical Exam Updated Vital Signs BP (!) 158/97   Pulse 87   Temp 98.2 F (36.8 C) (Oral)   Resp 16   Ht '5\' 5"'$  (1.651 m)   Wt 83.5 kg   SpO2 97%   BMI 30.62 kg/m  Physical Exam Vitals reviewed.  HENT:     Head: Normocephalic.  Cardiovascular:     Rate and Rhythm: Normal rate.  Pulmonary:     Effort: Pulmonary effort is normal.  Musculoskeletal:       Feet:  Neurological:     Mental Status: She is alert.  Psychiatric:        Mood and Affect: Mood normal.     ED Results / Procedures / Treatments   Labs (all labs ordered are listed, but only abnormal results are displayed) Labs Reviewed - No data to display  EKG None  Radiology DG Foot Complete Left  Result Date: 03/05/2022 CLINICAL DATA:  Pain and swelling EXAM: LEFT FOOT - COMPLETE 3+ VIEW COMPARISON:  None Available. FINDINGS: No displaced fracture or dislocation is seen. There is minimal cortical irregularity in the posteromedial aspect of left navicular. There are small linear calcific densities adjacent to the posteromedial aspect of navicular. Small bony spurs seen in first metatarsophalangeal joint.  IMPRESSION: There is minimal cortical irregularity and small linear densities are noted in the posteromedial aspect of navicular suggesting recent or old avulsions. Another possibility would be inflammatory or infectious process. If there is clinical suspicion for osteomyelitis, follow-up MRI may be considered. Electronically Signed   By: Elmer Picker M.D.   On: 03/05/2022 09:20    Procedures Procedures    Medications Ordered in ED Medications - No data to display  ED Course/ Medical Decision Making/ A&P                           Medical Decision Making Amount and/or Complexity of Data Reviewed Radiology: ordered and independent interpretation performed. Decision-making details documented in ED Course.  Risk Prescription drug management.   Patient presenting today with foot pain.  It is now been present for 5 days.  Is on the medial foot but not specifically over the plantar fascia.  The left ankle is normal and low suspicion for septic joint or gout.  There is no acute infectious findings on exam.  Concern for inflammation of tendons versus stress fracture.  Patient did take ibuprofen prior to arrival but may need a boot and crutches. I have independently visualized and interpreted pt's images today. X-ray shows some abnormality at the navicular bone.  Radiology reports cortical irregularity and a small linear density could be suggestive of a recent or old avulsions.  Patient is not displaying any findings suggestive of infection today but in the future may need an MRI.  Patient placed in a cam boot, crutches and sports medicine follow-up.  All questions were answered and she is comfortable with this plan.         Final Clinical Impression(s) / ED Diagnoses Final diagnoses:  Closed nondisplaced fracture of navicular bone of left foot, initial encounter    Rx / DC Orders ED Discharge Orders          Ordered    naproxen (NAPROSYN) 500 MG tablet  2 times daily         03/05/22 [place], [place], MD 03/05/22 [id number]

## 2022-03-05 NOTE — ED Triage Notes (Addendum)
C/o left ankle pain since Friday. Unsure of known injury. Swelling noted.  Saw PCP, stated questionable infection. Prescribed doxycycline, has not started prescription yet.

## 2022-03-05 NOTE — ED Notes (Signed)
Reviewed discharge instructions, recommendations for follow up with pt. Pt able to give return demonstration with use of crutches. Questions answered. States understanding. Pt discharged with family member in good conditon

## 2022-03-06 ENCOUNTER — Ambulatory Visit: Payer: Self-pay

## 2022-03-06 ENCOUNTER — Ambulatory Visit (INDEPENDENT_AMBULATORY_CARE_PROVIDER_SITE_OTHER): Payer: Federal, State, Local not specified - PPO | Admitting: Family Medicine

## 2022-03-06 VITALS — BP 134/91 | Ht 65.5 in | Wt 185.0 lb

## 2022-03-06 DIAGNOSIS — M79672 Pain in left foot: Secondary | ICD-10-CM | POA: Diagnosis not present

## 2022-03-06 NOTE — Patient Instructions (Signed)
We will go ahead with an MRI of your foot. Your pain is on the navicular - we need to assess if this is a stress fracture or osteomyelitis. Wear boot when up and around. Do not put weight on this leg though - use the crutches. Work from home in meantime - expect this to be 6 weeks at least. Icing 15 minutes at a time 3-4 times a day. Tylenol '500mg'$  1-2 tabs three times a day as needed for pain. Aleve 2 tabs twice a day with food as needed for pain and inflammation. We can do a stronger medication if needed - just let me know. Follow up will depend on the MRI results.

## 2022-03-07 ENCOUNTER — Encounter: Payer: Self-pay | Admitting: Family Medicine

## 2022-03-07 NOTE — Progress Notes (Signed)
PCP: [place].  Subjective:   HPI: Patient is a 46 y.o. female here for left foot pain.  Patient reports 5 days ago she woke up with fairly severe medial left foot pain. A good amount of swelling. Was red yesterday. No acute injury or trauma. Does not exercise regularly. Saw her PCP and was given antibiotics but she did not pick these up. She is diabetic. Seen in ED on 10/31 and had x-rays that showed cortical irregularity and densities posteromedial navicular - avulsion vs inflammatory or infectious process. No fever, chills, sweats.  Past Medical History:  Diagnosis Date   Anemia    WITH PREGNANCY   Arm fracture AGE 65   Asthma 08/2011   ALBUTEROL  INHALER PRN   Diabetes mellitus without complication (HCC)    EUMPNTIR(443.1)    MIGRAINES RESOLVED   Hypertension 2001   Infection    UTI X 1   Pregnancy induced hypertension    Preterm delivery, delivered 04/10/2012   Preterm labor 1995   Trichimoniasis    Yeast infection     Current Outpatient Medications on File Prior to Visit  Medication Sig Dispense Refill   albuterol (PROVENTIL HFA;VENTOLIN HFA) 108 (90 BASE) MCG/ACT inhaler Inhale 2 puffs into the lungs every 6 (six) hours as needed. Shortness of breath 1 Inhaler 4   amLODipine (NORVASC) 5 MG tablet Take by mouth.     cabergoline (DOSTINEX) 0.5 MG tablet Take by mouth.     clindamycin (CLEOCIN) 300 MG capsule Take 1 capsule (300 mg total) by mouth 3 (three) times daily. 30 capsule 0   hydrochlorothiazide (HYDRODIURIL) 25 MG tablet Take 1 tablet (25 mg total) by mouth daily. 30 tablet 0   Insulin Degludec (TRESIBA [place]) Inject into the skin.     METFORMIN HCL PO Take by mouth.     naproxen (NAPROSYN) 500 MG tablet Take 1 tablet (500 mg total) by mouth 2 (two) times daily. 20 tablet 0   omeprazole (PRILOSEC) 20 MG capsule Take 1 capsule (20 mg total) by mouth daily. 30 capsule 0   potassium chloride SA (KLOR-CON M) 20 MEQ tablet Take 2 tablets (40 mEq  total) by mouth daily for 5 days. 10 tablet 0   traMADol (ULTRAM) 50 MG tablet Take 1 tablet (50 mg total) by mouth every 6 (six) hours as needed. 15 tablet 0   No current facility-administered medications on file prior to visit.    Past Surgical History:  Procedure Laterality Date   WISDOM TOOTH EXTRACTION      Allergies  Allergen Reactions   Olmesartan Palpitations   Penicillins Swelling and Other (See Comments)    SEVERE SWELLING OF LIPS. HAS TAKEN KEFLEX WITHOUT DIFFICULTY   Pravastatin Other (See Comments)    MYALGIA PAIN   Bromocriptine Other (See Comments)   Cabergoline Other (See Comments)    Mood swings    BP (!) 134/91   Ht 5' 5.5" (1.664 m)   Wt 185 lb (83.9 kg)   BMI 30.32 kg/m       No data to display              No data to display              Objective:  Physical Exam:  Gen: NAD, comfortable in exam room  Left foot/ankle: Localized swelling over navicular medial foot.  Mild warmth, no erythema.  No other gross deformity, swelling, ecchymoses FROM with pain on internal and external rotation felt  medial foot TTP navicular exquisitely Negative ant drawer and negative talar tilt.   Thompsons test negative. NV intact distally.  Limited MSK u/s left foot: Post tib tendon intact with minimal tenosynovitis distally.  Pain with sonopalpation over the navicular.  Cortical irregularity proximal navicular but without significant edema - probable os naviculare. Assessment & Plan:  1. Left foot pain - severe, localized to the navicular bone.  Radiographs, ultrasound both show a cortical irregularity but ultrasound without edema over this suggestive of os naviculare or old fibrous nonunion of a fracture.  She is diabetic. Had significant swelling with redness and warmth of foot when this started and is not physically active with exercise.  Concern for possible stress fracture or osteomyelitis - will order urgent MRI.  Boot when up and walking around, non  weight bearing.  Icing, tylenol, aleve.  F/u depends on MRI.

## 2022-03-08 ENCOUNTER — Ambulatory Visit
Admission: RE | Admit: 2022-03-08 | Discharge: 2022-03-08 | Disposition: A | Discharge status: Home / Self Care | Payer: Federal, State, Local not specified - PPO | Source: Ambulatory Visit | Attending: Family Medicine | Admitting: Family Medicine

## 2022-03-08 DIAGNOSIS — M65872 Other synovitis and tenosynovitis, left ankle and foot: Secondary | ICD-10-CM | POA: Diagnosis not present

## 2022-03-08 DIAGNOSIS — M19072 Primary osteoarthritis, left ankle and foot: Secondary | ICD-10-CM | POA: Diagnosis not present

## 2022-03-08 DIAGNOSIS — M79672 Pain in left foot: Secondary | ICD-10-CM

## 2022-03-08 DIAGNOSIS — M7989 Other specified soft tissue disorders: Secondary | ICD-10-CM | POA: Diagnosis not present

## 2022-03-08 DIAGNOSIS — R600 Localized edema: Secondary | ICD-10-CM | POA: Diagnosis not present

## 2022-03-15 ENCOUNTER — Ambulatory Visit: Payer: Federal, State, Local not specified - PPO | Admitting: Internal Medicine

## 2022-03-27 ENCOUNTER — Ambulatory Visit (INDEPENDENT_AMBULATORY_CARE_PROVIDER_SITE_OTHER): Payer: Federal, State, Local not specified - PPO | Admitting: Family Medicine

## 2022-03-27 VITALS — BP 153/89 | Ht 65.0 in | Wt 185.0 lb

## 2022-03-27 DIAGNOSIS — M79672 Pain in left foot: Secondary | ICD-10-CM | POA: Diagnosis not present

## 2022-03-27 MED ORDER — DICLOFENAC SODIUM 75 MG PO TBEC: 75.0000 mg | DELAYED_RELEASE_TABLET | Freq: Two times a day (BID) | ORAL | 1 refills | Status: DC

## 2022-03-27 NOTE — Patient Instructions (Signed)
You have achilles tendinitis and plantar fasciitis. Diclofenac '75mg'$  twice a day with food for pain and inflammation. Icing 15 minutes as needed 3-4 times a day. Sports insoles with scaphoid pads and heel lifts. Avoid flat shoes, barefoot walking as much as possible. Follow up with me in 3-4 weeks.

## 2022-03-27 NOTE — Progress Notes (Cosign Needed)
SUBJECTIVE:   Chief Complaint: left foot pain  History of Presenting Illness:   Ms Danielle [name] is a 46 year old female with a past medical history of obesity who presents for follow-up of left foot pain.  Patient states that her left foot has been painful since 10-31 after she wore shoes with very little support. She presented to the ED where foot radiograph was negative for fracture and given a boot with crutches. Since then, she has been wearing the boot and using the crutches outside the home. Throughout the day, she wears the boot and elevates her leg, trying to avoid any physical activity that might exacerbate the pain. Over the last several weeks, the pain has decreased significantly. Initially, she took ibuprofen every six hours as needed but has not taken any in several days. Has tried ice packs as well, which helps a little. Has not tried heat or any topical medications. Currently, she is able to put some but not all of her weight onto her left leg.    Recent Imaging:    Xray 10-31 There is minimal cortical irregularity and small linear densities are noted in the posteromedial aspect of navicular suggesting recent or old avulsions. Another possibility would be inflammatory or infectious process. If there is clinical suspicion for osteomyelitis, follow-up MRI may be considered:  MRI 11-3 1. No evidence of acute osteomyelitis or acute fracture. 2. Minimal cortical irregularity of the navicular tuberosity at the posterior tibial tendon insertion on recent 03/05/2022 radiographs likely reflects mild chronic enthesopathic change. Mild chronic insertional tendinosis of the posterior tibial tendon in this region. There is mild adjacent medial subcutaneous fat edema and swelling. 3. Minimal flexor hallucis longus tenosynovitis at the level of the cuneiforms. 4. Minimal soft tissue swelling and edema within the plantar aspect of the second toe.    Pertinent Medical History:     Past Medical History:  Diagnosis Date   Anemia    WITH PREGNANCY   Arm fracture AGE 51   Asthma 08/2011   ALBUTEROL  INHALER PRN   Diabetes mellitus without complication ([place])    [id number](638.7)    MIGRAINES RESOLVED   Hypertension 2001   Infection    UTI X 1   Pregnancy induced hypertension    Preterm delivery, delivered 04/10/2012   Preterm labor 1995   Trichimoniasis    Yeast infection       OBJECTIVE:   BP (!) 153/89   Ht '5\' 5"'$  (1.651 m)   Wt 185 lb (83.9 kg)   BMI 30.79 kg/m   Physical Examination:  Lower extremities No erythema or swelling, R calf visibly larger than L, mild tenderness to palpation over medical aspect of L plantar arch and L Achilles tendon, strength 5/5 with plantar-dorsiflexion and knee flexion-extension bilaterally, pain with L foot eversion>inversion    ASSESSMENT/PLAN:   Patient's presentation is most consistent with mild plantar fasciitis, Achilles tendinitis, and peroneal tendinitis secondary to prolonged inactivity in setting of foot immobilization with boot and heavy reliance on crutches during ambulation.  > Diclofenac '75mg'$  q12 with food > Cryotherapy with ice 52mn as needed 3-4 times per day > Insoles with scaphoid pads plus heel lifts to reduce strain on Achilles and plantar fascia > Physical activity especially barefoot walking as tolerated to strengthen left lower extremity > [facility]   There are no diagnoses linked to this encounter.  No follow-ups on file.  DRoswell Nickel MD  03/27/2022, 11:19 AM

## 2022-04-15 ENCOUNTER — Ambulatory Visit (INDEPENDENT_AMBULATORY_CARE_PROVIDER_SITE_OTHER): Payer: Federal, State, Local not specified - PPO | Admitting: Family Medicine

## 2022-04-15 VITALS — BP 140/92 | Ht 64.5 in | Wt 187.0 lb

## 2022-04-15 DIAGNOSIS — M79672 Pain in left foot: Secondary | ICD-10-CM | POA: Diagnosis not present

## 2022-04-15 MED ORDER — DICLOFENAC SODIUM 75 MG PO TBEC: 75.0000 mg | DELAYED_RELEASE_TABLET | Freq: Two times a day (BID) | ORAL | 1 refills | Status: AC

## 2022-04-15 NOTE — Patient Instructions (Signed)
You have plantar fasciitis. Diclofenac '75mg'$  twice a day with food for pain and inflammation. Icing 15 minutes as needed 3-4 times a day. Start physical therapy and do home exercises on days you don't go to therapy. Cam walker when up and walking around - come out of this to do the exercises. Avoid flat shoes and barefoot walking as much as possible. Follow up with me in 5-6 weeks.

## 2022-04-15 NOTE — Progress Notes (Cosign Needed)
SUBJECTIVE:   Chief Complaint: Left foot pain  History of Presenting Illness:   Danielle [name] is a 46 year old female with a past medical history of left foot pain, last evaluated at [facility] on 11-22 for the same complaint. In brief, she wore shoes with very little support on 10-31, which aggravated her left foot. She then immobilized the foot with a boot and crutches in an attempt to prevent further pain. When she started to bear weight on her foot again, it was very painful along the plantar arch and heel.  Per last [facility] visit on 11-22: Patient states that her left foot has been painful since 10-31 after she wore shoes with very little support. She presented to the ED where foot radiograph was negative for fracture and given a boot with crutches. Since then, she has been wearing the boot and using the crutches outside the home. Throughout the day, she wears the boot and elevates her leg, trying to avoid any physical activity that might exacerbate the pain. Over the last several weeks, the pain has decreased significantly. Initially, she took ibuprofen every six hours as needed but has not taken any in several days. Has tried ice packs as well, which helps a little. Has not tried heat or any topical medications. Currently, she is able to put some but not all of her weight onto her left leg.   At her last [facility] visit, she was diagnosed with Achilles tendinitis and plantar fasciitis. She was prescribed diclofenac, instructions for cryotherapy, and sports insoles with scaphoid pads plus heel lifts. Additionally, she was told to avoid flat shoes and barefoot walking to allow recovery.  Today, she reports complete resolution of her heel pain, but worsening of the pain along her plantar arch. She feels like someone is ripping her arch apart whenever pressure is applied to the area. Never picked up the diclofenac, but has been icing intermittently. She tried the insoles, but these worsened the pain, so she stopped.     Pertinent Medical History:   Achilles tendinitis  Plantar fasciitis  Past Medical History:  Diagnosis Date   Anemia    WITH PREGNANCY   Arm fracture AGE 36   Asthma 08/2011   ALBUTEROL  INHALER PRN   Diabetes mellitus without complication ([place])    EHUDJSHF(026.3)    MIGRAINES RESOLVED   Hypertension 2001   Infection    UTI X 1   Pregnancy induced hypertension    Preterm delivery, delivered 04/10/2012   Preterm labor 1995   Trichimoniasis    Yeast infection       OBJECTIVE:   BP (!) 140/92   Ht 5' 4.5" (1.638 m)   Wt 187 lb (84.8 kg)   BMI 31.60 kg/m   Physical Examination:  Left foot   Inspection ----- No atrophy, swelling, discoloration Motion --------- Full with eversion, inversion, flexion, extension Palpation ------ Tenderness along plantar arch most prominent medially, no tenderness over heel Sensation ----- Intact to light touch dermatomes L5-S1 Strength ------- 5/5 with inversion, eversion, flexion, extension Tests ----------- Talar tilt negative, anterior drawer negative   ASSESSMENT/PLAN:   Patient's presentation is most consistent with atypical plantar fasciitis.  > Diclofenac '75mg'$  bid for pain > Cryotherapy for 77mn intervals > Targeted physical therapy and home exercises > Cam walker when ambulating, avoid flat shoes and barefoot walking  There are no diagnoses linked to this encounter.  No follow-ups on file.  DRoswell Nickel MD  04/15/2022, 3:31 PM

## 2022-04-16 ENCOUNTER — Encounter: Payer: Self-pay | Admitting: Family Medicine

## 2022-04-17 ENCOUNTER — Ambulatory Visit: Payer: Federal, State, Local not specified - PPO | Admitting: Family Medicine

## 2022-04-18 ENCOUNTER — Ambulatory Visit: Payer: Federal, State, Local not specified - PPO | Admitting: Nurse Practitioner

## 2022-05-14 ENCOUNTER — Ambulatory Visit: Payer: Federal, State, Local not specified - PPO | Admitting: Physical Therapy

## 2022-05-17 ENCOUNTER — Ambulatory Visit: Payer: Federal, State, Local not specified - PPO | Admitting: Physician Assistant

## 2022-05-21 ENCOUNTER — Ambulatory Visit: Payer: Federal, State, Local not specified - PPO

## 2022-05-26 NOTE — Therapy (Incomplete)
OUTPATIENT PHYSICAL THERAPY LOWER EXTREMITY EVALUATION   Patient Name: Danielle [name] MRN: [id number] DOB:August 28, 1975, 47 y.o., female Today's Date: 05/26/2022  END OF SESSION:   Past Medical History:  Diagnosis Date   Anemia    WITH PREGNANCY   Arm fracture AGE 86   Asthma 08/2011   ALBUTEROL  INHALER PRN   Diabetes mellitus without complication ([place])    LZJQBHAL(937.9)    MIGRAINES RESOLVED   Hypertension 2001   Infection    UTI X 1   Pregnancy induced hypertension    Preterm delivery, delivered 04/10/2012   Preterm labor 1995   Trichimoniasis    Yeast infection    Past Surgical History:  Procedure Laterality Date   WISDOM TOOTH EXTRACTION     Patient Active Problem List   Diagnosis Date Noted   Preterm labor 03/23/2012   Flu 02/40/9735   Asthma complicating pregnancy, antepartum 02/22/2012   Smoker 02/22/2012   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  Rationale for Evaluation and Treatment: Rehabilitation  ONSET DATE: ***  SUBJECTIVE:   SUBJECTIVE STATEMENT: ***  PERTINENT HISTORY: DM, anemia, HTN, Asthma PAIN:  Are you having pain? Yes: NPRS scale: ***/10 Pain location: *** Pain description: *** Aggravating factors: *** Relieving factors: ***  PRECAUTIONS: {Therapy precautions:24002}  WEIGHT BEARING RESTRICTIONS: {Yes ***/No:24003}  FALLS:  Has patient fallen in last 6 months? {fallsyesno:27318}  LIVING ENVIRONMENT: Lives with: {OPRC lives with:25569::"lives with their family"} Lives in: {Lives in:25570} Stairs: {opstairs:27293} Has following equipment at home: {Assistive devices:23999}  OCCUPATION: ***  PLOF: {PLOF:24004}  PATIENT GOALS: ***  NEXT MD VISIT:   OBJECTIVE:   DIAGNOSTIC FINDINGS: *** MRI 03/08/22 IMPRESSION: 1. No evidence of acute  osteomyelitis or acute fracture. 2. Minimal cortical irregularity of the navicular tuberosity at the posterior tibial tendon insertion on recent 03/05/2022 radiographs likely reflects mild chronic enthesopathic change. Mild chronic insertional tendinosis of the posterior tibial tendon in this region. There is mild adjacent medial subcutaneous fat edema and swelling. 3. Minimal flexor hallucis longus tenosynovitis at the level of the cuneiforms. 4. Minimal soft tissue swelling and edema within the plantar aspect of the second toe.  [place] 03/06/22 Narrative & Impression  Limited MSK u/s left foot: Post tib tendon intact with minimal tenosynovitis distally.  Pain with sonopalpation over the navicular.  Cortical irregularity proximal navicular but without significant edema - probable os naviculare.    XRAY 03/05/22 IMPRESSION: There is minimal cortical irregularity and small linear densities are noted in the posteromedial aspect of navicular suggesting recent or old avulsions. Another possibility would be inflammatory or infectious process. If there is clinical suspicion for osteomyelitis, follow-up MRI may be considered.   PATIENT SURVEYS:  LEFS ***  COGNITION: Overall cognitive status: {cognition:24006}     SENSATION: {sensation:27233}  EDEMA:  {edema:24020}  MUSCLE LENGTH: HS: Quads: ITB: Piriformis: Hip Flexors: Heelcords:   POSTURE: {posture:25561}  PALPATION: Palpation: TTP at ***. Increased tissue tension in ***  Patellar Mobility:     LOWER EXTREMITY ROM:  {AROM/PROM:27142} ROM Right eval Left eval  Hip flexion    Hip extension    Hip abduction    Hip adduction    Hip internal rotation    Hip external rotation    Knee flexion    Knee extension    Ankle dorsiflexion  Ankle plantarflexion    Ankle inversion    Ankle eversion     (Blank rows = not tested)  LOWER EXTREMITY MMT:  MMT Right eval Left eval  Hip flexion    Hip extension     Hip abduction    Hip adduction    Hip internal rotation    Hip external rotation    Knee flexion    Knee extension    Ankle dorsiflexion    Ankle plantarflexion    Ankle inversion    Ankle eversion     (Blank rows = not tested)  LOWER EXTREMITY SPECIAL TESTS:  {LEspecialtests:26242}  FUNCTIONAL TESTS:  {Functional tests:24029}  GAIT: Distance walked: *** Assistive device utilized: {Assistive devices:23999} Level of assistance: {Levels of assistance:24026} Comments: ***   TODAY'S TREATMENT:                                                                                                                              DATE: ***  05/27/22  See pt ed  PATIENT EDUCATION:  Education details: {Education details:27468}  Person educated: {Person educated:25204} Education method: {Education Method:25205} Education comprehension: {Education Comprehension:25206}  HOME EXERCISE PROGRAM: ***  ASSESSMENT:  CLINICAL IMPRESSION: Patient is a 47 y.o. female who was seen today for physical therapy evaluation and treatment for left foot pain. ***.   OBJECTIVE IMPAIRMENTS: {opptimpairments:25111}.   ACTIVITY LIMITATIONS: {activitylimitations:27494}  PARTICIPATION LIMITATIONS: {participationrestrictions:25113}  PERSONAL FACTORS: {Personal factors:25162} are also affecting patient's functional outcome.   REHAB POTENTIAL: {rehabpotential:25112}  CLINICAL DECISION MAKING: {clinical decision making:25114}  EVALUATION COMPLEXITY: {Evaluation complexity:25115}   GOALS: Goals reviewed with patient? {yes/no:20286}  SHORT TERM GOALS: Target date: {follow up:25551} (Remove Blue Hyperlink)  Patient will be independent with initial HEP. Baseline: *** Goal status: {GOALSTATUS:25110}  2.  *** Baseline: *** Goal status: {GOALSTATUS:25110}  3.  *** Baseline: *** Goal status: {GOALSTATUS:25110}   LONG TERM GOALS: Target date: {follow up:25551} (Remove Blue Hyperlink)  Patient  will be independent with advanced/ongoing HEP to improve outcomes and carryover.  Baseline: *** Goal status: {GOALSTATUS:25110}  2.  Patient will report at least 75% improvement in *** ankle/foot pain to improve QOL. Baseline: *** Goal status: {GOALSTATUS:25110}  3.  Patient will demonstrate improved *** ankle AROM to {Functional status:27472} to allow for normal gait and stair mechanics. Baseline: *** Goal status: {GOALSTATUS:25110}  4.  Patient will demonstrate improved functional LE strength as demonstrated by ***. Baseline: *** Goal status: {GOALSTATUS:25110}  5.  Patient will be able to ambulate 600' with LRAD and normal gait pattern without increased foot/ankle pain to access community.  Baseline: *** Goal status: {GOALSTATUS:25110}  6. Patient will be able to ascend/descend stairs with 1 HR and reciprocal step pattern safely to access home and community.  Baseline: *** Goal status: {GOALSTATUS:25110}  7.  Patient will report *** on *** (patient reported outcome measure) to demonstrate improved functional ability. Baseline: *** Goal status: {GOALSTATUS:25110}  8.  Patient will demonstrate at least  19/24 on DGI to decrease risk of falls. Baseline: *** Goal status: {GOALSTATUS:25110}   9.  *** Baseline: *** Goal status: {GOALSTATUS:25110}   PLAN:  PT FREQUENCY: {rehab frequency:25116}  PT DURATION: {rehab duration:25117}  PLANNED INTERVENTIONS: {rehab planned interventions:25118::"Therapeutic exercises","Therapeutic activity","Neuromuscular re-education","Balance training","Gait training","Patient/Family education","Self Care","Joint mobilization"}  PLAN FOR NEXT SESSION: ***   Sallee Hogrefe, PT 05/26/2022, 8:57 PM

## 2022-05-27 ENCOUNTER — Ambulatory Visit: Payer: Federal, State, Local not specified - PPO | Admitting: Physical Therapy

## 2022-05-27 ENCOUNTER — Ambulatory Visit: Payer: Federal, State, Local not specified - PPO | Admitting: Family Medicine

## 2022-05-29 ENCOUNTER — Ambulatory Visit: Payer: Federal, State, Local not specified - PPO | Admitting: Family Medicine

## 2022-05-30 ENCOUNTER — Ambulatory Visit: Payer: Federal, State, Local not specified - PPO | Admitting: Family Medicine

## 2022-05-30 ENCOUNTER — Telehealth: Payer: Self-pay | Admitting: Family Medicine

## 2022-05-30 NOTE — Telephone Encounter (Signed)
pt called stating she has a boot on her foot, weather is causing pain, 1st no show, fee waived, pt has been rescheduled, pt was advised of no show policy

## 2022-06-04 ENCOUNTER — Encounter: Payer: Federal, State, Local not specified - PPO | Admitting: Physical Therapy

## 2022-06-04 NOTE — Progress Notes (Deleted)
06/04/2022 Danielle [name] [id number] March 29, 1976  Referring provider: [facility], I* Primary GI doctor: {acdocs:27040}  ASSESSMENT AND PLAN:   There are no diagnoses linked to this encounter.   Patient Care Team: [place]. as PCP - General  HISTORY OF PRESENT ILLNESS: 47 y.o. female with a past medical history of hypertension, type 2 diabetes and others listed below presents for evaluation of epigastric pain.   11/22/2021 R UQ [place] right upper quadrant pain no gallstones no wall thickening no fatty liver, no focal lesions. Alk phos 148 11/22/2021, other transaminases unremarkable, normal total bili. CBC  11/22/2021  HGB 12.2 MCV 85.1 without evidence of anemia WBC 14.6 Platelets 446 Kidney function 11/22/2021  BUN 9 Cr 0.72  GFR >60  Potassium 3.8   LFTs 11/22/2021  AST 18 ALT 17 Alkphos 148 TBili 0.4 11/22/2021 LIPASE 26   She  reports that she has been smoking cigarettes. She has a 10.00 pack-year smoking history. She has never used smokeless tobacco. She reports that she does not drink alcohol and does not use drugs.  Current Medications:   Current Outpatient Medications (Endocrine & Metabolic):    cabergoline (DOSTINEX) 0.5 MG tablet, Take by mouth.   Insulin Degludec (TRESIBA [place]), Inject into the skin.   METFORMIN HCL PO, Take by mouth.  Current Outpatient Medications (Cardiovascular):    amLODipine (NORVASC) 5 MG tablet, Take by mouth.   hydrochlorothiazide (HYDRODIURIL) 25 MG tablet, Take 1 tablet (25 mg total) by mouth daily.  Current Outpatient Medications (Respiratory):    albuterol (PROVENTIL HFA;VENTOLIN HFA) 108 (90 BASE) MCG/ACT inhaler, Inhale 2 puffs into the lungs every 6 (six) hours as needed. Shortness of breath  Current Outpatient Medications (Analgesics):    diclofenac (VOLTAREN) 75 MG EC tablet, Take 1 tablet (75 mg total) by mouth 2 (two) times daily.   traMADol (ULTRAM) 50 MG tablet, Take 1 tablet (50 mg total) by  mouth every 6 (six) hours as needed.   Current Outpatient Medications (Other):    clindamycin (CLEOCIN) 300 MG capsule, Take 1 capsule (300 mg total) by mouth 3 (three) times daily.   omeprazole (PRILOSEC) 20 MG capsule, Take 1 capsule (20 mg total) by mouth daily.   potassium chloride SA (KLOR-CON M) 20 MEQ tablet, Take 2 tablets (40 mEq total) by mouth daily for 5 days.  Medical History:  Past Medical History:  Diagnosis Date   Anemia    WITH PREGNANCY   Arm fracture AGE 71   Asthma 08/2011   ALBUTEROL  INHALER PRN   Diabetes mellitus without complication ([facility])    [id number])    MIGRAINES RESOLVED   Hypertension 2001   Infection    UTI X 1   Pregnancy induced hypertension    Preterm delivery, delivered 04/10/2012   Preterm labor 1995   Trichimoniasis    Yeast infection    Allergies:  Allergies  Allergen Reactions   Olmesartan Palpitations   Penicillins Swelling and Other (See Comments)    SEVERE SWELLING OF LIPS. HAS TAKEN KEFLEX WITHOUT DIFFICULTY   Pravastatin Other (See Comments)    MYALGIA PAIN   Bromocriptine Other (See Comments)   Cabergoline Other (See Comments)    Mood swings     Surgical History:  She  has a past surgical history that includes Wisdom tooth extraction. Family History:  Her family history includes Cancer in her paternal grandfather and paternal grandmother; Diabetes in her maternal grandfather, mother, and paternal aunt; Drug abuse in  her mother; Heart disease in her maternal grandfather; Hypertension in her maternal grandfather, mother, paternal grandfather, and sister; Stroke in her paternal grandfather.  REVIEW OF SYSTEMS  : All other systems reviewed and negative except where noted in the History of Present Illness.  PHYSICAL EXAM: There were no vitals taken for this visit. General:   Pleasant, well developed female in no acute distress Head:   Normocephalic and atraumatic. Eyes:  sclerae anicteric,conjunctive pink  Heart:   {HEART  EXAM HEM/ONC:21750} Pulm:  Clear anteriorly; no wheezing Abdomen:   {BlankSingle:19197::"Distended","Ridged","Soft"}, {BlankSingle:19197::"Flat","Obese","Non-distended"} AB, {BlankSingle:19197::"Absent","Hyperactive, tinkling","Hypoactive","Sluggish","Active"} bowel sounds. {actendernessAB:27319} tenderness {anatomy; site abdomen:5010}. {BlankMultiple:19196::"Without guarding","With guarding","Without rebound","With rebound"}, No organomegaly appreciated. Rectal: {acrectalexam:27461} Extremities:  {With/Without:304960234} edema. Msk: Symmetrical without gross deformities. Peripheral pulses intact.  Neurologic:  Alert and  oriented x4;  No focal deficits.  Skin:   Dry and intact without significant lesions or rashes. Psychiatric:  Cooperative. Normal mood and affect.  RELEVANT LABS AND IMAGING: CBC    Component Value Date/Time   WBC 14.6 (H) 11/22/2021 1058   RBC 4.43 11/22/2021 1058   HGB 12.2 11/22/2021 1058   HCT 37.7 11/22/2021 1058   PLT 446 (H) 11/22/2021 1058   MCV 85.1 11/22/2021 1058   MCH 27.5 11/22/2021 1058   MCHC 32.4 11/22/2021 1058   RDW 14.7 11/22/2021 1058   LYMPHSABS 3.1 11/22/2021 1058   MONOABS 0.9 11/22/2021 1058   EOSABS 0.2 11/22/2021 1058   BASOSABS 0.1 11/22/2021 1058    CMP     Component Value Date/Time   NA 138 11/22/2021 1058   K 3.8 11/22/2021 1058   CL 105 11/22/2021 1058   CO2 26 11/22/2021 1058   GLUCOSE 110 (H) 11/22/2021 1058   BUN 9 11/22/2021 1058   CREATININE 0.72 11/22/2021 1058   CREATININE 0.45 (L) 02/11/2012 1255   CALCIUM 9.8 11/22/2021 1058   PROT 7.5 11/22/2021 1058   ALBUMIN 4.3 11/22/2021 1058   AST 18 11/22/2021 1058   ALT 17 11/22/2021 1058   ALKPHOS 148 (H) 11/22/2021 1058   BILITOT 0.4 11/22/2021 1058   GFRNONAA >60 11/22/2021 1058   GFRAA >60 12/27/2019 [place] Copper Basnett, PA-C 8:34 AM

## 2022-06-05 ENCOUNTER — Ambulatory Visit: Payer: Federal, State, Local not specified - PPO | Admitting: Physician Assistant

## 2022-06-10 ENCOUNTER — Ambulatory Visit: Payer: Federal, State, Local not specified - PPO

## 2022-06-17 ENCOUNTER — Encounter: Payer: Federal, State, Local not specified - PPO | Admitting: Physical Therapy

## 2022-06-18 ENCOUNTER — Ambulatory Visit: Payer: Federal, State, Local not specified - PPO | Admitting: Family Medicine

## 2022-06-18 ENCOUNTER — Telehealth: Payer: Self-pay | Admitting: Family Medicine

## 2022-06-18 NOTE — Telephone Encounter (Signed)
Pt was a no show for a NP app on 06/18/22 with Dr Grandville Silos, I sent a letter.

## 2022-06-20 ENCOUNTER — Encounter: Payer: Federal, State, Local not specified - PPO | Admitting: Physical Therapy

## 2022-06-21 NOTE — Telephone Encounter (Signed)
2nd no show for new pt visit, unable to reschedule at [facility], fee generated

## 2022-06-27 ENCOUNTER — Encounter: Payer: Federal, State, Local not specified - PPO | Admitting: Physical Therapy

## 2022-07-01 ENCOUNTER — Encounter: Payer: Federal, State, Local not specified - PPO | Admitting: Physical Therapy

## 2022-09-13 DIAGNOSIS — Z1330 Encounter for screening examination for mental health and behavioral disorders, unspecified: Secondary | ICD-10-CM | POA: Diagnosis not present

## 2022-09-13 DIAGNOSIS — R748 Abnormal levels of other serum enzymes: Secondary | ICD-10-CM | POA: Diagnosis not present

## 2022-09-13 DIAGNOSIS — Z794 Long term (current) use of insulin: Secondary | ICD-10-CM | POA: Diagnosis not present

## 2022-09-13 DIAGNOSIS — I10 Essential (primary) hypertension: Secondary | ICD-10-CM | POA: Diagnosis not present

## 2022-09-13 DIAGNOSIS — E1165 Type 2 diabetes mellitus with hyperglycemia: Secondary | ICD-10-CM | POA: Diagnosis not present

## 2022-09-13 DIAGNOSIS — Z1211 Encounter for screening for malignant neoplasm of colon: Secondary | ICD-10-CM | POA: Diagnosis not present

## 2022-11-27 DIAGNOSIS — Z1231 Encounter for screening mammogram for malignant neoplasm of breast: Secondary | ICD-10-CM | POA: Diagnosis not present

## 2022-11-27 DIAGNOSIS — R92323 Mammographic fibroglandular density, bilateral breasts: Secondary | ICD-10-CM | POA: Diagnosis not present

## 2022-11-29 DIAGNOSIS — E669 Obesity, unspecified: Secondary | ICD-10-CM | POA: Diagnosis not present

## 2022-11-29 DIAGNOSIS — E785 Hyperlipidemia, unspecified: Secondary | ICD-10-CM | POA: Diagnosis not present

## 2022-11-29 DIAGNOSIS — I10 Essential (primary) hypertension: Secondary | ICD-10-CM | POA: Diagnosis not present

## 2022-11-29 DIAGNOSIS — E1165 Type 2 diabetes mellitus with hyperglycemia: Secondary | ICD-10-CM | POA: Diagnosis not present

## 2022-11-29 DIAGNOSIS — Z0000 Encounter for general adult medical examination without abnormal findings: Secondary | ICD-10-CM | POA: Diagnosis not present

## 2022-11-29 DIAGNOSIS — Z794 Long term (current) use of insulin: Secondary | ICD-10-CM | POA: Diagnosis not present

## 2022-12-11 ENCOUNTER — Ambulatory Visit: Payer: Federal, State, Local not specified - PPO | Admitting: Physician Assistant

## 2023-01-19 ENCOUNTER — Emergency Department (HOSPITAL_BASED_OUTPATIENT_CLINIC_OR_DEPARTMENT_OTHER)
Admission: EM | Admit: 2023-01-19 | Discharge: 2023-01-19 | Disposition: A | Discharge status: Home / Self Care | Payer: Federal, State, Local not specified - PPO | Attending: Emergency Medicine | Admitting: Emergency Medicine

## 2023-01-19 ENCOUNTER — Other Ambulatory Visit: Payer: Self-pay

## 2023-01-19 ENCOUNTER — Encounter (HOSPITAL_BASED_OUTPATIENT_CLINIC_OR_DEPARTMENT_OTHER): Payer: Self-pay | Admitting: Emergency Medicine

## 2023-01-19 DIAGNOSIS — E860 Dehydration: Secondary | ICD-10-CM | POA: Diagnosis not present

## 2023-01-19 DIAGNOSIS — Z79899 Other long term (current) drug therapy: Secondary | ICD-10-CM | POA: Insufficient documentation

## 2023-01-19 DIAGNOSIS — E1165 Type 2 diabetes mellitus with hyperglycemia: Secondary | ICD-10-CM | POA: Insufficient documentation

## 2023-01-19 DIAGNOSIS — R739 Hyperglycemia, unspecified: Secondary | ICD-10-CM | POA: Diagnosis present

## 2023-01-19 DIAGNOSIS — I10 Essential (primary) hypertension: Secondary | ICD-10-CM | POA: Insufficient documentation

## 2023-01-19 DIAGNOSIS — Z794 Long term (current) use of insulin: Secondary | ICD-10-CM | POA: Insufficient documentation

## 2023-01-19 LAB — BASIC METABOLIC PANEL
Anion gap: 13 (ref 5–15)
BUN: 15 mg/dL (ref 6–20)
CO2: 23 mmol/L (ref 22–32)
Calcium: 9.5 mg/dL (ref 8.9–10.3)
Chloride: 92 mmol/L — ABNORMAL LOW (ref 98–111)
Creatinine, Ser: 0.89 mg/dL (ref 0.44–1.00)
GFR, Estimated: >60 mL/min (ref >60)
Glucose, Bld: 590 mg/dL (ref 70–99)
Potassium: 3.5 mmol/L (ref 3.5–5.1)
Sodium: 128 mmol/L — ABNORMAL LOW (ref 135–145)

## 2023-01-19 LAB — CBC
HCT: 39.9 % (ref 36.0–46.0)
Hemoglobin: 13.2 g/dL (ref 12.0–15.0)
MCH: 27.8 pg (ref 26.0–34.0)
MCHC: 33.1 g/dL (ref 30.0–36.0)
MCV: 84 fL (ref 80.0–100.0)
Platelets: 391 10*3/uL (ref 150–400)
RBC: 4.75 MIL/uL (ref 3.87–5.11)
RDW: 13.2 % (ref 11.5–15.5)
WBC: 13.4 10*3/uL — ABNORMAL HIGH (ref 4.0–10.5)
nRBC: 0 % (ref 0.0–0.2)

## 2023-01-19 LAB — CBG MONITORING, ED
Glucose-Capillary: 538 mg/dL (ref 70–99)
Glucose-Capillary: 416 mg/dL — ABNORMAL HIGH (ref 70–99)

## 2023-01-19 LAB — URINALYSIS, ROUTINE W REFLEX MICROSCOPIC
Bilirubin Urine: NEGATIVE
Glucose, UA: >=500 mg/dL — AB
Ketones, ur: NEGATIVE mg/dL
Leukocytes,Ua: NEGATIVE
Nitrite: NEGATIVE
Protein, ur: 100 mg/dL — AB
Specific Gravity, Urine: 1.01 (ref 1.005–1.030)
pH: 6 (ref 5.0–8.0)

## 2023-01-19 LAB — URINALYSIS, MICROSCOPIC (REFLEX): Bacteria, UA: NONE SEEN

## 2023-01-19 MED ORDER — SODIUM CHLORIDE 0.9 % IV BOLUS
1000.0000 mL | Freq: Once | INTRAVENOUS | Status: AC
  Administered 2023-01-19: 1000 mL via INTRAVENOUS

## 2023-01-19 MED ORDER — INSULIN ASPART PROT & ASPART (70-30 MIX) 100 UNIT/ML ~~LOC~~ SUSP
8.0000 [IU] | Freq: Once | SUBCUTANEOUS | Status: AC
  Administered 2023-01-19: 8 [IU] via SUBCUTANEOUS
  Filled 2023-01-19: qty 8

## 2023-01-19 MED ORDER — INSULIN DEGLUDEC 100 UNIT/ML ~~LOC~~ SOPN: 16.0000 [IU] | PEN_INJECTOR | Freq: Every day | SUBCUTANEOUS | 1 refills | Status: AC

## 2023-01-19 NOTE — ED Provider Notes (Signed)
[place] [facility] Provider Note   CSN: [id number] Arrival date & time: 01/19/23  4098     History  Chief Complaint  Patient presents with   Hyperglycemia    Danielle [name] is a 47 y.o. female.  The history is provided by the patient and medical records.  Hyperglycemia Danielle [name] is a 47 y.o. female who presents to the Emergency Department complaining of hyperglycemia.  She presents to the emergency department for elevated blood sugar for the last week with her blood sugars running around 300.  She reports feeling unwell with intermittent blurred vision, excessive thirst and excessive urination.  No fever, cough, nausea, vomiting, diarrhea, dysuria.  She started on Mounjaro last week at 2.5 mg and took her second dose today at 415.  Previously she had been on Rybelsus and before that she was on [place] and Humalog.  She also has a history of hypertension.  Aside from feeling unwell from the hyperglycemia she has no additional acute complaints.    Home Medications Prior to Admission medications   Medication Sig Start Date End Date Taking? Authorizing Provider  insulin degludec (TRESIBA) 100 UNIT/ML FlexTouch Pen Inject 16 Units into the skin daily. 01/19/23  Yes Tilden Fossa, MD  albuterol (PROVENTIL HFA;VENTOLIN HFA) 108 (90 BASE) MCG/ACT inhaler Inhale 2 puffs into the lungs every 6 (six) hours as needed. Shortness of breath 02/04/12   Nigel Bridgeman, CNM  amLODipine (NORVASC) 5 MG tablet Take by mouth. 07/02/19   [provider]  cabergoline (DOSTINEX) 0.5 MG tablet Take by mouth. 11/16/19   [provider]  clindamycin (CLEOCIN) 300 MG capsule Take 1 capsule (300 mg total) by mouth 3 (three) times daily. 02/21/14   Rodolph Bong, MD  diclofenac (VOLTAREN) 75 MG EC tablet Take 1 tablet (75 mg total) by mouth 2 (two) times daily. 04/15/22   Hudnall, Azucena Fallen, MD  hydrochlorothiazide (HYDRODIURIL) 25 MG tablet Take 1  tablet (25 mg total) by mouth daily. 10/04/17   Gerhard Munch, MD  METFORMIN HCL PO Take by mouth.    [provider]  omeprazole (PRILOSEC) 20 MG capsule Take 1 capsule (20 mg total) by mouth daily. 11/22/21   Jeannie Fend, PA-C  potassium chloride SA (KLOR-CON M) 20 MEQ tablet Take 2 tablets (40 mEq total) by mouth daily for 5 days. 06/01/21 06/06/21  Long, Arlyss Repress, MD  traMADol (ULTRAM) 50 MG tablet Take 1 tablet (50 mg total) by mouth every 6 (six) hours as needed. 10/04/17   Gerhard Munch, MD      Allergies    Olmesartan, Penicillins, Pravastatin, Bromocriptine, and Cabergoline    Review of Systems   Review of Systems  All other systems reviewed and are negative.   Physical Exam Updated Vital Signs BP (!) 127/105   Pulse 84   Temp 98.1 F (36.7 C) (Oral)   Resp 17   Ht 5' 4.5" (1.638 m)   Wt 82.1 kg   SpO2 100%   BMI 30.59 kg/m  Physical Exam Vitals and nursing note reviewed.  Constitutional:      Appearance: She is well-developed.  HENT:     Head: Normocephalic and atraumatic.  Cardiovascular:     Rate and Rhythm: Normal rate and regular rhythm.     Heart sounds: No murmur heard. Pulmonary:     Effort: Pulmonary effort is normal. No respiratory distress.     Breath sounds: Normal breath sounds.  Abdominal:  Palpations: Abdomen is soft.     Tenderness: There is no abdominal tenderness. There is no guarding or rebound.  Musculoskeletal:        General: No swelling or tenderness.     Comments: 2+ DP pulses  Skin:    General: Skin is warm and dry.  Neurological:     Mental Status: She is alert and oriented to person, place, and time.  Psychiatric:        Behavior: Behavior normal.     ED Results / Procedures / Treatments   Labs (all labs ordered are listed, but only abnormal results are displayed) Labs Reviewed  BASIC METABOLIC PANEL - Abnormal; Notable for the following components:      Result Value   Sodium 128 (*)    Chloride 92 (*)     Glucose, Bld 590 (*)    All other components within normal limits  CBC - Abnormal; Notable for the following components:   WBC 13.4 (*)    All other components within normal limits  URINALYSIS, ROUTINE W REFLEX MICROSCOPIC - Abnormal; Notable for the following components:   Glucose, UA >=500 (*)    Hgb urine dipstick MODERATE (*)    Protein, ur 100 (*)    All other components within normal limits  CBG MONITORING, ED - Abnormal; Notable for the following components:   Glucose-Capillary 538 (*)    All other components within normal limits  URINALYSIS, MICROSCOPIC (REFLEX)    EKG None  Radiology No results found.  Procedures Procedures    Medications Ordered in ED Medications  sodium chloride 0.9 % bolus 1,000 mL (1,000 mLs Intravenous New Bag/Given 01/19/23 7829)  sodium chloride 0.9 % bolus 1,000 mL (1,000 mLs Intravenous New Bag/Given 01/19/23 0629)  insulin aspart protamine- aspart (NOVOLOG MIX 70/30) injection 8 Units (8 Units Subcutaneous Given 01/19/23 0703)    ED Course/ Medical Decision Making/ A&P                                 Medical Decision Making Amount and/or Complexity of Data Reviewed Labs: ordered.  Risk Prescription drug management.   Patient with history of diabetes recently switched to [facility] here for evaluation of elevated blood sugars for 1 week.  She is nontoxic-appearing on evaluation, mildly dehydrated.  Labs significant for hyperglycemia, pseudohyponatremia.  CBC with leukocytosis, which is similar when compared to priors.  UA is not consistent with UTI.  There is no evidence of DKA.  She was treated with IV fluid hydration for her hyperglycemia and dehydration.  Patient will need additional insulin coverage while she goes up on her Mounjaro dosing.  Will restart her Tresiba at 16 units.  She was previously on 25 units.  Discussed with patient importance of outpatient follow-up for recheck as well as return precautions.        Final  Clinical Impression(s) / ED Diagnoses Final diagnoses:  Hyperglycemia  Dehydration    Rx / DC Orders ED Discharge Orders          Ordered    insulin degludec (TRESIBA) 100 UNIT/ML FlexTouch Pen  Daily        01/19/23 0634    Ambulatory referral to Endocrinology        01/19/23 5621              Tilden Fossa, MD 01/19/23 0710

## 2023-01-19 NOTE — ED Triage Notes (Addendum)
Pt reports starting Mounjaro last week. She states her sugars have not been well controlled so far on this medication and today were in the 560s. She last took her mounjaro at 0415 this morning. Reports increasing blurred vision since at least the beginning of the week. Polyuria, and polydipsia x 1 week "or a little over a week".

## 2023-01-19 NOTE — ED Notes (Signed)
Patient ambulated to the bathroom independently.

## 2023-02-25 ENCOUNTER — Ambulatory Visit: Payer: Self-pay | Admitting: Nurse Practitioner

## 2024-07-14 ENCOUNTER — Ambulatory Visit: Payer: Self-pay | Admitting: Nurse Practitioner
